# Patient Record
Sex: Male | Born: 1985 | Race: Black or African American | Hispanic: No | Marital: Married | Smoking: Never smoker
Health system: Southern US, Community
[De-identification: ages and names within clinical notes are randomized; demographics above are authoritative.]

## PROBLEM LIST (undated history)

## (undated) DIAGNOSIS — T7840XA Allergy, unspecified, initial encounter: Secondary | ICD-10-CM

## (undated) DIAGNOSIS — F419 Anxiety disorder, unspecified: Secondary | ICD-10-CM

## (undated) HISTORY — PX: VASECTOMY: SHX75

## (undated) HISTORY — DX: Allergy, unspecified, initial encounter: T78.40XA

## (undated) HISTORY — DX: Anxiety disorder, unspecified: F41.9

---

## 2005-03-08 ENCOUNTER — Emergency Department (HOSPITAL_COMMUNITY): Admission: EM | Admit: 2005-03-08 | Discharge: 2005-03-08 | Payer: Self-pay | Admitting: Family Medicine

## 2014-05-28 ENCOUNTER — Ambulatory Visit (INDEPENDENT_AMBULATORY_CARE_PROVIDER_SITE_OTHER): Admitting: Physician Assistant

## 2014-05-28 ENCOUNTER — Encounter: Payer: Self-pay | Admitting: Physician Assistant

## 2014-05-28 VITALS — BP 114/72 | HR 68 | Temp 98.0°F | Resp 18 | Ht 69.25 in | Wt 224.0 lb

## 2014-05-28 DIAGNOSIS — Z0000 Encounter for general adult medical examination without abnormal findings: Secondary | ICD-10-CM | POA: Diagnosis not present

## 2014-05-28 DIAGNOSIS — J309 Allergic rhinitis, unspecified: Secondary | ICD-10-CM | POA: Diagnosis not present

## 2014-05-28 MED ORDER — FEXOFENADINE-PSEUDOEPHED ER 60-120 MG PO TB12
1.0000 | ORAL_TABLET | Freq: Two times a day (BID) | ORAL | Status: DC | PRN
Start: 2014-05-28 — End: 2014-09-19

## 2014-05-28 NOTE — Progress Notes (Signed)
Patient ID: Richard Patrick MRN: [id number], DOB: 1985/05/18 29 y.o. Date of Encounter: 05/28/2014, 3:47 PM    Chief Complaint: Physical (CPE)  HPI: 29 y.o. y/o AA male here for CPE.   He is also being seen as a new patient to establish care.  He states that Allegra-D works well for his controlling his allergy symptoms. However he is wanting us to send in a prescription for this as it would be cheaper for him that way.  No other complaints or concerns today.  He is in the [employer]. He is married with 3 children ages 2920 months, 29 years old, and 29 years old. Says they just recently moved here from [place]. Prior to [place], [place]. Says he knows that they will be here for 3 years at least. 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  Says he is coming here this Wednesday morning to bring his daughter for an appointment. He can come fasting at that time and do lab work then.   Review of Systems: Consitutional: No fever, chills, fatigue, night sweats, lymphadenopathy, or weight changes. Eyes: No visual changes, eye redness, or discharge. ENT/Mouth: Ears: No otalgia, tinnitus, hearing loss, discharge. Nose:  sinus pain, or epistaxis. Throat: No sore throat, or teeth pain. Cardiovascular: No CP, palpitations, diaphoresis, DOE, edema, orthopnea, PND. Respiratory: No cough, hemoptysis, SOB, or wheezing. Gastrointestinal: No anorexia, dysphagia, reflux, pain, nausea, vomiting, hematemesis, diarrhea, constipation, BRBPR, or melena. Genitourinary: No dysuria, frequency, urgency, hematuria, incontinence, nocturia, decreased urinary stream, discharge, impotence, or testicular pain/masses. Musculoskeletal: No decreased ROM, myalgias, stiffness, joint swelling, or weakness. Skin: No rash, erythema, lesion changes, pain, warmth, jaundice, or pruritis. Neurological: No headache, dizziness, syncope,  seizures, tremors, memory loss, coordination problems, or paresthesias. Psychological: No anxiety, depression, hallucinations, SI/HI. Endocrine: No fatigue, polydipsia, polyphagia, polyuria, or known diabetes. All other systems were reviewed and are otherwise negative.  History reviewed. No pertinent past medical history.   History reviewed. No pertinent past surgical history.  Home Meds:  No outpatient prescriptions prior to visit.   No facility-administered medications prior to visit.    Allergies: No Known Allergies  History   Social History  . Marital Status: Married    Spouse Name: N/A  . Number of Children: N/A  . Years of Education: N/A   Occupational History  . Not on file.   Social History Main Topics  . Smoking status: Never Smoker   . Smokeless tobacco: Never Used  . Alcohol Use: 0.0 oz/week    0 Standard drinks or equivalent per week  . Drug Use: No  . Sexual Activity: Not on file   Other Topics Concern  . Not on file   Social History Narrative   Entered 05/2014:       Married.    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    Says was with Security Clearance. Just given this assignment 1 month ago.              History reviewed. No pertinent family history.  Physical Exam: Blood pressure 114/72, pulse 68, temperature 98 F (36.7 C), temperature source Oral, resp. rate 18, height 5' 9.25" (1.759 m), weight 224 lb (101.606 kg).  General: Well developed, well nourished,muscular AAM. Appears in no acute distress. HEENT: Normocephalic, atraumatic.  Conjunctiva pink, sclera non-icteric. Pupils 2 mm constricting to 1 mm, round, regular, and equally reactive to light and accomodation. EOMI. Internal auditory canal clear. TMs with good cone of light and without pathology. Nasal mucosa pink. Nares are without discharge. No sinus tenderness. Oral mucosa pink. Dentition good. Pharynx without exudate.      Neck: Supple. Trachea midline. No thyromegaly. Full ROM. No lymphadenopathy. Lungs: Clear to auscultation bilaterally without wheezes, rales, or rhonchi. Breathing is of normal effort and unlabored. Cardiovascular: RRR with S1 S2. No murmurs, rubs, or gallops. Distal pulses 2+ symmetrically. No carotid or abdominal bruits. Abdomen: Soft, non-tender, non-distended with normoactive bowel sounds. No hepatosplenomegaly or masses. No rebound/guarding. No CVA tenderness. No hernias. Musculoskeletal: Full range of motion and 5/5 strength throughout. Skin: Warm and moist without erythema, ecchymosis, wounds, or rash. Neuro: A+Ox3. CN II-XII grossly intact. Moves all extremities spontaneously. Full sensation throughout. Normal gait. DTR 2+ throughout upper and lower extremities.  Psych:  Responds to questions appropriately with a normal affect.   Assessment/Plan:  29 y.o. y/o  AA male here for CPE  -1. Visit for preventive health examination  A. Screening Labs: - CBC with Differential/Platelet; Future - COMPLETE METABOLIC PANEL WITH GFR; Future - Lipid panel; Future - TSH; Future - Vit D  25 hydroxy (rtn osteoporosis monitoring); Future   B. Screening For Prostate Cancer: Need to start this at age 82 given that he is African-American  C. Screening For Colorectal Cancer:  No indication to need this until age 27  D. Immunizations: Flu-------------------------N/A Tetanus------------------ Pt states that this is up to date and he has this record at home and will bring it and when he Returns here Wednesday Pneumococcal------------- no indication to need this until age 42 Zostavax----------------- not indicated until age 26   2. Allergic rhinitis, unspecified allergic rhinitis type - fexofenadine-pseudoephedrine (ALLEGRA-D) 60-120 MG per tablet; Take 1 tablet by mouth 2 (two) times daily as needed.  Dispense: 60 tablet; Refill: 6   Signed:   [address] [place], [place]  05/28/2014 3:47  PM

## 2014-05-29 ENCOUNTER — Encounter: Payer: Self-pay | Admitting: Family Medicine

## 2014-05-30 ENCOUNTER — Other Ambulatory Visit

## 2014-05-30 DIAGNOSIS — Z0000 Encounter for general adult medical examination without abnormal findings: Secondary | ICD-10-CM

## 2014-05-30 LAB — CBC WITH DIFFERENTIAL/PLATELET
BASOS ABS: 0.1 10*3/uL (ref 0.0–0.1)
Basophils Relative: 1 % (ref 0–1)
EOS ABS: 0.1 10*3/uL (ref 0.0–0.7)
Eosinophils Relative: 2 % (ref 0–5)
HCT: 46 % (ref 39.0–52.0)
Hemoglobin: 16.2 g/dL (ref 13.0–17.0)
Lymphocytes Relative: 31 % (ref 12–46)
Lymphs Abs: 2 10*3/uL (ref 0.7–4.0)
MCH: 31 pg (ref 26.0–34.0)
MCHC: 35.2 g/dL (ref 30.0–36.0)
MCV: 88 fL (ref 78.0–100.0)
MPV: 10.9 fL (ref 8.6–12.4)
Monocytes Absolute: 0.6 10*3/uL (ref 0.1–1.0)
Monocytes Relative: 9 % (ref 3–12)
NEUTROS ABS: 3.8 10*3/uL (ref 1.7–7.7)
Neutrophils Relative %: 57 % (ref 43–77)
PLATELETS: 191 10*3/uL (ref 150–400)
RBC: 5.23 MIL/uL (ref 4.22–5.81)
RDW: 13.4 % (ref 11.5–15.5)
WBC: 6.6 10*3/uL (ref 4.0–10.5)

## 2014-05-30 LAB — COMPLETE METABOLIC PANEL WITH GFR
ALT: 31 U/L (ref 0–53)
AST: 45 U/L — ABNORMAL HIGH (ref 0–37)
Albumin: 4.6 g/dL (ref 3.5–5.2)
Alkaline Phosphatase: 65 U/L (ref 39–117)
BUN: 17 mg/dL (ref 6–23)
CO2: 25 meq/L (ref 19–32)
CREATININE: 1.13 mg/dL (ref 0.50–1.35)
Calcium: 9.3 mg/dL (ref 8.4–10.5)
Chloride: 103 mEq/L (ref 96–112)
GFR, Est African American: >89 mL/min
GFR, Est Non African American: 87 mL/min
Glucose, Bld: 80 mg/dL (ref 70–99)
Potassium: 4.2 mEq/L (ref 3.5–5.3)
Sodium: 138 mEq/L (ref 135–145)
Total Bilirubin: 0.9 mg/dL (ref 0.2–1.2)
Total Protein: 7.5 g/dL (ref 6.0–8.3)

## 2014-05-30 LAB — TSH: TSH: 0.839 u[IU]/mL (ref 0.350–4.500)

## 2014-05-30 LAB — LIPID PANEL
CHOL/HDL RATIO: 2.9 ratio
CHOLESTEROL: 154 mg/dL (ref 0–200)
HDL: 53 mg/dL (ref >=40)
LDL Cholesterol: 88 mg/dL (ref 0–99)
Triglycerides: 64 mg/dL (ref <150)
VLDL: 13 mg/dL (ref 0–40)

## 2014-05-31 LAB — VITAMIN D 25 HYDROXY (VIT D DEFICIENCY, FRACTURES): VIT D 25 HYDROXY: 18 ng/mL — AB (ref 30–100)

## 2014-06-05 ENCOUNTER — Telehealth: Payer: Self-pay | Admitting: Family Medicine

## 2014-06-05 DIAGNOSIS — E559 Vitamin D deficiency, unspecified: Secondary | ICD-10-CM | POA: Insufficient documentation

## 2014-06-05 NOTE — Telephone Encounter (Signed)
-----   Message from Dorena BodoMary B Dixon, PA-C sent at 05/31/2014  7:47 AM EDT ----- Vitamin D level is low. Start over-the-counter vitamin D 1,000 IU QD.. Add vitamin D deficiency to problem list and add the vitamin D 1000 units to the medicine list. All other labs are normal. Cholesterol levels are excellent.

## 2014-06-05 NOTE — Telephone Encounter (Signed)
Pt aware of lab results and provider recommendations 

## 2014-09-19 ENCOUNTER — Encounter: Payer: Self-pay | Admitting: Physician Assistant

## 2014-09-19 ENCOUNTER — Ambulatory Visit (INDEPENDENT_AMBULATORY_CARE_PROVIDER_SITE_OTHER): Admitting: Physician Assistant

## 2014-09-19 VITALS — BP 98/64 | HR 72 | Temp 98.5°F | Resp 16 | Ht 69.0 in | Wt 218.0 lb

## 2014-09-19 DIAGNOSIS — L700 Acne vulgaris: Secondary | ICD-10-CM | POA: Diagnosis not present

## 2014-09-19 MED ORDER — CLINDAMYCIN PHOS-BENZOYL PEROX 1-5 % EX GEL: Freq: Two times a day (BID) | CUTANEOUS | Status: DC

## 2014-09-19 MED ORDER — MINOCYCLINE HCL 50 MG PO TABS: 50.0000 mg | ORAL_TABLET | Freq: Two times a day (BID) | ORAL | Status: DC

## 2014-09-19 NOTE — Progress Notes (Signed)
    Patient ID: Richard Patrick MRN: [id number], DOB: [birth year], 29 y.o. Date of Encounter: 09/19/2014, 2:27 PM    Chief Complaint:  Chief Complaint  Patient presents with  . Acne     HPI: 29 y.o. year old AA male here to get treatment for acne.   Points to some acne that is on right side of lower chin and neck. Says this is main area where he has been getting significant acne recently. Says sometimes gets some bumps scattered around other areas of the face. Currently is using no medication/treatment for this.  States that he does not get acne on his chest or his back/shoulders.     Home Meds:   Outpatient Prescriptions Prior to Visit  Medication Sig Dispense Refill  . cholecalciferol (VITAMIN D) 1000 UNITS tablet Take 1,000 Units by mouth daily.    . fexofenadine-pseudoephedrine (ALLEGRA-D) 60-120 MG per tablet Take 1 tablet by mouth 2 (two) times daily as needed. 60 tablet 6   No facility-administered medications prior to visit.    Allergies: No Known Allergies    Review of Systems: See HPI for pertinent ROS. All other ROS negative.    Physical Exam: Blood pressure 98/64, pulse 72, temperature 98.5 F (36.9 C), temperature source Oral, resp. rate 16, height  (1.753 m), weight 218 lb (98.884 kg)., Body mass index is 32.18 kg/(m^2). General: WNWD AAM.  Appears in no acute distress. Neck: Supple. No thyromegaly. No lymphadenopathy. Lungs: Clear bilaterally to auscultation without wheezes, rales, or rhonchi. Breathing is unlabored. Heart: Regular rhythm. No murmurs, rubs, or gallops. Msk:  Strength and tone normal for age. Skin: Area between chin/neck: There are a couple of cystic acne lesions present in this area.  There are scattered acne papules on cheeks and forehead.  Neuro: Alert and oriented X 3. Moves all extremities spontaneously. Gait is normal. CNII-XII grossly in tact. Psych:  Responds to questions appropriately with a normal affect.     ASSESSMENT AND  PLAN:  29 y.o. year old male with  1. Acne vulgaris - minocycline (DYNACIN) 50 MG tablet; Take 1 tablet (50 mg total) by mouth 2 (two) times daily.  Dispense: 60 tablet; Refill: 5 - clindamycin-benzoyl peroxide (BENZACLIN) gel; Apply topically 2 (two) times daily.  Dispense: 25 g; Refill: 2  Told him he can take the minocycline for a couple of months to get the acne controlled and then can try coming off this medication. Told him that when applying the gel, to apply very small amount just right on the acne bump. Explained that if he applies a larger amount or to a larger area of skin that is going to cause irritation and dryness.  Follow-up if this does not keep his acne controlled.  Murray Hodgkins [place], [place], [facility] 09/19/2014 2:27 PM

## 2014-11-29 ENCOUNTER — Emergency Department (HOSPITAL_COMMUNITY)
Admission: EM | Admit: 2014-11-29 | Discharge: 2014-11-29 | Disposition: A | Discharge status: Home / Self Care | Attending: Emergency Medicine | Admitting: Emergency Medicine

## 2014-11-29 ENCOUNTER — Encounter (HOSPITAL_COMMUNITY): Payer: Self-pay | Admitting: Emergency Medicine

## 2014-11-29 ENCOUNTER — Emergency Department (HOSPITAL_COMMUNITY)

## 2014-11-29 DIAGNOSIS — Z792 Long term (current) use of antibiotics: Secondary | ICD-10-CM | POA: Diagnosis not present

## 2014-11-29 DIAGNOSIS — R0789 Other chest pain: Secondary | ICD-10-CM | POA: Diagnosis present

## 2014-11-29 LAB — BASIC METABOLIC PANEL
Anion gap: 7 (ref 5–15)
BUN: 19 mg/dL (ref 6–20)
CO2: 26 mmol/L (ref 22–32)
CREATININE: 1.07 mg/dL (ref 0.61–1.24)
Calcium: 9.6 mg/dL (ref 8.9–10.3)
Chloride: 106 mmol/L (ref 101–111)
GFR calc Af Amer: >60 mL/min (ref >60)
GFR calc non Af Amer: >60 mL/min (ref >60)
Glucose, Bld: 92 mg/dL (ref 65–99)
Potassium: 4.1 mmol/L (ref 3.5–5.1)
Sodium: 139 mmol/L (ref 135–145)

## 2014-11-29 LAB — CBC WITH DIFFERENTIAL/PLATELET
Basophils Absolute: 0 10*3/uL (ref 0.0–0.1)
Basophils Relative: 0 %
EOS ABS: 0.1 10*3/uL (ref 0.0–0.7)
Eosinophils Relative: 1 %
HCT: 43.7 % (ref 39.0–52.0)
HEMOGLOBIN: 15.9 g/dL (ref 13.0–17.0)
LYMPHS ABS: 1.9 10*3/uL (ref 0.7–4.0)
LYMPHS PCT: 34 %
MCH: 31.4 pg (ref 26.0–34.0)
MCHC: 36.4 g/dL — ABNORMAL HIGH (ref 30.0–36.0)
MCV: 86.2 fL (ref 78.0–100.0)
Monocytes Absolute: 0.4 10*3/uL (ref 0.1–1.0)
Monocytes Relative: 8 %
NEUTROS PCT: 57 %
Neutro Abs: 3.1 10*3/uL (ref 1.7–7.7)
Platelets: 136 10*3/uL — ABNORMAL LOW (ref 150–400)
RBC: 5.07 MIL/uL (ref 4.22–5.81)
RDW: 11.9 % (ref 11.5–15.5)
WBC: 5.5 10*3/uL (ref 4.0–10.5)

## 2014-11-29 LAB — I-STAT TROPONIN, ED: TROPONIN I, POC: 0 ng/mL (ref 0.00–0.08)

## 2014-11-29 NOTE — ED Provider Notes (Signed)
CSN: [id number]     Arrival date & time 11/29/14  1242 History   First MD Initiated Contact with Patient 11/29/14 1312     Chief Complaint  Patient presents with  . Rib Injury     (Consider location/radiation/quality/duration/timing/severity/associated sxs/prior Treatment) HPI Comments: Pt comes in with left sided chest pain that has been going on for several weeks. The pain is constant in nature. Nothing really makes it better or worse. No recent injury. He has not had cough or fever. Denies sob. He tried ibuprofen without relief.  No n/v. He states that he has been very tired. He states that he had similar symptoms about 6 weeks ago that lasted for about a week and it resolved on its own  The history is provided by the patient. No language interpreter was used.    Past Medical History  Diagnosis Date  . Allergy     seasonal   History reviewed. No pertinent past surgical history. No family history on file. Social History  Substance Use Topics  . Smoking status: Never Smoker   . Smokeless tobacco: Never Used  . Alcohol Use: 0.0 oz/week    0 Standard drinks or equivalent per week     Comment: occasionally    Review of Systems  All other systems reviewed and are negative.     Allergies  Review of patient's allergies indicates no known allergies.  Home Medications   Prior to Admission medications   Medication Sig Start Date End Date Taking? Authorizing Provider  clindamycin-benzoyl peroxide (BENZACLIN) gel Apply topically 2 (two) times daily. 09/19/14   Patriciaann Clan Dixon, PA-C  minocycline (DYNACIN) 50 MG tablet Take 1 tablet (50 mg total) by mouth 2 (two) times daily. 09/19/14   Patriciaann Clan Dixon, PA-C   BP 134/84 mmHg  Pulse 66  Temp(Src) 98.4 F (36.9 C) (Oral)  Resp 18  Ht  (1.727 m)  Wt 208 lb (94.348 kg)  BMI 31.63 kg/m2  SpO2 97% Physical Exam  Constitutional: He is oriented to person, place, and time. He appears well-developed and well-nourished.  HENT:  Head:  Normocephalic and atraumatic.  Cardiovascular: Normal rate and regular rhythm.   Pulmonary/Chest: Effort normal and breath sounds normal.  Lateral left mid chest tender to palpation  Abdominal: Soft. Bowel sounds are normal. There is no tenderness.  Musculoskeletal: Normal range of motion.  Neurological: He is alert and oriented to person, place, and time.  Skin: Skin is warm and dry.  Nursing note and vitals reviewed.   ED Course  Procedures (including critical care time) Labs Review Labs Reviewed  CBC WITH DIFFERENTIAL/PLATELET - Abnormal; Notable for the following:    MCHC 36.4 (*)    Platelets 136 (*)    All other components within normal limits  BASIC METABOLIC PANEL  I-STAT TROPOININ, ED    Imaging Review Dg Chest 2 View  11/29/2014  CLINICAL DATA:  Left chest pain for 2 weeks EXAM: CHEST  2 VIEW COMPARISON:  None. FINDINGS: The heart size and mediastinal contours are within normal limits. Both lungs are clear. The visualized skeletal structures are unremarkable. IMPRESSION: No active cardiopulmonary disease. Electronically Signed   By: Sherian Rein M.D.   On: 11/29/2014 14:03   I have personally reviewed and evaluated these images and lab results as part of my medical decision-making.   EKG Interpretation   Date/Time:  Thursday November 29 2014 14:04:36 EDT Ventricular Rate:  61 PR Interval:  159 QRS Duration: 83 QT Interval:  398 QTC Calculation: 401 R Axis:   46 Text Interpretation:  Sinus arrhythmia RSR' in V1 or V2, probably normal  variant No prior for comparison Confirmed by [facility]  MD, BLAIR ([id number]) on  11/29/2014 2:10:34 PM      MDM   Final diagnoses:  Chest wall pain    Not acute findings to suggest reason for symptoms. Discussed use of continued anti inflammatories with pt. Discussed return precautions.    Teressa LowerVrinda Marriana Hibberd, NP 11/29/14 1510  Elwin MochaBlair Walden, MD 11/29/14 [phone]

## 2014-11-29 NOTE — ED Notes (Signed)
PA at bedside.

## 2014-11-29 NOTE — Discharge Instructions (Signed)

## 2014-11-29 NOTE — ED Notes (Signed)
Patient reports progressive left sided rib pain x several weeks, constant in nature now. Patient denies any injury. Denies sob or cp. Patient also reports generalized fatigue with no specific symptoms.

## 2014-11-29 NOTE — ED Notes (Signed)
Onset 2.5 weeks ago left rib pain denies trauma pain currently 4/10 sharp pain.

## 2014-11-30 ENCOUNTER — Ambulatory Visit: Admitting: Family Medicine

## 2015-02-14 ENCOUNTER — Other Ambulatory Visit: Payer: Self-pay | Admitting: *Deleted

## 2015-02-14 DIAGNOSIS — L700 Acne vulgaris: Secondary | ICD-10-CM

## 2015-02-14 MED ORDER — CLINDAMYCIN PHOS-BENZOYL PEROX 1-5 % EX GEL: Freq: Two times a day (BID) | CUTANEOUS | Status: DC

## 2015-02-14 MED ORDER — MINOCYCLINE HCL 50 MG PO TABS: 50.0000 mg | ORAL_TABLET | Freq: Two times a day (BID) | ORAL | Status: DC

## 2015-02-14 NOTE — Telephone Encounter (Signed)
Patient in office with daughter.   Requested refill on meds.   Prescription sent to pharmacy.

## 2015-03-13 ENCOUNTER — Telehealth: Payer: Self-pay | Admitting: Physician Assistant

## 2015-03-13 DIAGNOSIS — L700 Acne vulgaris: Secondary | ICD-10-CM

## 2015-03-13 MED ORDER — MINOCYCLINE HCL 50 MG PO TABS: 50.0000 mg | ORAL_TABLET | Freq: Two times a day (BID) | ORAL | Status: DC

## 2015-03-13 NOTE — Telephone Encounter (Signed)
Prescription sent to pharmacy.

## 2015-03-13 NOTE — Telephone Encounter (Signed)
Patient is switching pharmacies and would like his minocycline sent to [facility]

## 2015-03-19 ENCOUNTER — Ambulatory Visit (INDEPENDENT_AMBULATORY_CARE_PROVIDER_SITE_OTHER): Admitting: Family Medicine

## 2015-03-19 ENCOUNTER — Encounter: Payer: Self-pay | Admitting: Family Medicine

## 2015-03-19 VITALS — BP 128/70 | HR 72 | Temp 98.5°F | Resp 16 | Ht 68.5 in | Wt 222.0 lb

## 2015-03-19 DIAGNOSIS — Z309 Encounter for contraceptive management, unspecified: Secondary | ICD-10-CM

## 2015-03-19 DIAGNOSIS — L700 Acne vulgaris: Secondary | ICD-10-CM

## 2015-03-19 DIAGNOSIS — Z3009 Encounter for other general counseling and advice on contraception: Secondary | ICD-10-CM

## 2015-03-19 MED ORDER — CLINDAMYCIN PHOS-BENZOYL PEROX 1-5 % EX GEL: Freq: Two times a day (BID) | CUTANEOUS | Status: DC

## 2015-03-19 MED ORDER — MINOCYCLINE HCL 50 MG PO CAPS: 50.0000 mg | ORAL_CAPSULE | Freq: Two times a day (BID) | ORAL | Status: DC

## 2015-03-19 NOTE — Patient Instructions (Addendum)
[facility] - Dr Vernie Ammons  F/U as needed

## 2015-03-19 NOTE — Progress Notes (Signed)
Patient ID: Richard Patrick, male   DOB: [birth year], 30 y.o.   MRN: [id number]   Subjective:    Patient ID: Richard Patrick, male    DOB: 28-Sep-1985, 30 y.o.   MRN: [id number]  Patient presents for Referral issue here for referral for vasectomy. He has 3 children and he has wife desire contraception. He does not have any cardiac disease, history of any blood clots or difficulty with bleeding. He is only on minocycline and topical BenzaClin gel for his acne vulgaris. 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 He exercises daily. He has no problems with erectile dysfunction or any urinary problems.    Review Of Systems:  GEN- denies fatigue, fever, weight loss,weakness, recent illness ABD- denies N/V, change in stools, abd pain GU- denies dysuria, hematuria, dribbling, incontinence        Objective:    BP 128/70 mmHg  Pulse 72  Temp(Src) 98.5 F (36.9 C) (Oral)  Resp 16  Ht 5' 8.5" (1.74 m)  Wt 222 lb (100.699 kg)  BMI 33.26 kg/m2 GEN- NAD, alert and oriented x3 CVS- RRR, no murmur RESP-CTAB GU- deferred          Assessment & Plan:      Problem List Items Addressed This Visit    Acne vulgaris - Primary   Relevant Medications   clindamycin-benzoyl peroxide (BENZACLIN) gel   minocycline (MINOCIN,DYNACIN) 50 MG capsule    Other Visit Diagnoses    Vasectomy evaluation        Referral to urology     Relevant Orders    Ambulatory referral to Urology       Note: This dictation was prepared with Dragon dictation along with smaller phrase technology. Any transcriptional errors that result from this process are unintentional.

## 2015-03-29 ENCOUNTER — Ambulatory Visit: Admitting: Family Medicine

## 2015-05-30 ENCOUNTER — Encounter: Payer: Self-pay | Admitting: Family Medicine

## 2015-06-05 ENCOUNTER — Emergency Department (HOSPITAL_COMMUNITY)

## 2015-06-05 ENCOUNTER — Encounter (HOSPITAL_COMMUNITY): Payer: Self-pay | Admitting: Emergency Medicine

## 2015-06-05 DIAGNOSIS — R011 Cardiac murmur, unspecified: Secondary | ICD-10-CM | POA: Diagnosis not present

## 2015-06-05 DIAGNOSIS — Z792 Long term (current) use of antibiotics: Secondary | ICD-10-CM | POA: Diagnosis not present

## 2015-06-05 DIAGNOSIS — R0789 Other chest pain: Secondary | ICD-10-CM | POA: Insufficient documentation

## 2015-06-05 DIAGNOSIS — R079 Chest pain, unspecified: Secondary | ICD-10-CM | POA: Diagnosis present

## 2015-06-05 LAB — CBC
HCT: 44.4 % (ref 39.0–52.0)
Hemoglobin: 16.2 g/dL (ref 13.0–17.0)
MCH: 31.3 pg (ref 26.0–34.0)
MCHC: 36.5 g/dL — AB (ref 30.0–36.0)
MCV: 85.9 fL (ref 78.0–100.0)
Platelets: 183 10*3/uL (ref 150–400)
RBC: 5.17 MIL/uL (ref 4.22–5.81)
RDW: 12 % (ref 11.5–15.5)
WBC: 10.3 10*3/uL (ref 4.0–10.5)

## 2015-06-05 LAB — BASIC METABOLIC PANEL
Anion gap: 11 (ref 5–15)
BUN: 18 mg/dL (ref 6–20)
CALCIUM: 9.9 mg/dL (ref 8.9–10.3)
CO2: 24 mmol/L (ref 22–32)
CREATININE: 1.25 mg/dL — AB (ref 0.61–1.24)
Chloride: 104 mmol/L (ref 101–111)
GFR calc Af Amer: >60 mL/min (ref >60)
GLUCOSE: 116 mg/dL — AB (ref 65–99)
Potassium: 4.3 mmol/L (ref 3.5–5.1)
Sodium: 139 mmol/L (ref 135–145)

## 2015-06-05 LAB — I-STAT TROPONIN, ED: TROPONIN I, POC: 0.03 ng/mL (ref 0.00–0.08)

## 2015-06-05 NOTE — ED Notes (Signed)
Pt. reports right chest pain onset last night " sharp" worse when raising his right arm , denies SOB , no nausea or diaphoresis .

## 2015-06-06 ENCOUNTER — Emergency Department (HOSPITAL_COMMUNITY)
Admission: EM | Admit: 2015-06-06 | Discharge: 2015-06-06 | Disposition: A | Discharge status: Home / Self Care | Attending: Emergency Medicine | Admitting: Emergency Medicine

## 2015-06-06 DIAGNOSIS — R0789 Other chest pain: Secondary | ICD-10-CM

## 2015-06-06 MED ORDER — IBUPROFEN 800 MG PO TABS
800.0000 mg | ORAL_TABLET | Freq: Once | ORAL | Status: DC
  Filled 2015-06-06: qty 1

## 2015-06-06 MED ORDER — NAPROXEN 500 MG PO TABS: 500.0000 mg | ORAL_TABLET | Freq: Two times a day (BID) | ORAL | Status: DC

## 2015-06-06 NOTE — Discharge Instructions (Signed)
Talk with your doctor about whether ultrasound would be indicated. Apply ice or heat as needed.    Chest Wall Pain Chest wall pain is pain in or around the bones and muscles of your chest. Sometimes, an injury causes this pain. Sometimes, the cause may not be known. This pain may take several weeks or longer to get better. HOME CARE INSTRUCTIONS  Pay attention to any changes in your symptoms. Take these actions to help with your pain:   Rest as told by your health care provider.   Avoid activities that cause pain. These include any activities that use your chest muscles or your abdominal and side muscles to lift heavy items.   If directed, apply ice to the painful area:  Put ice in a plastic bag.  Place a towel between your skin and the bag.  Leave the ice on for 20 minutes, 2-3 times per day.  Take over-the-counter and prescription medicines only as told by your health care provider.  Do not use tobacco products, including cigarettes, chewing tobacco, and e-cigarettes. If you need help quitting, ask your health care provider.  Keep all follow-up visits as told by your health care provider. This is important. SEEK MEDICAL CARE IF:  You have a fever.  Your chest pain becomes worse.  You have new symptoms. SEEK IMMEDIATE MEDICAL CARE IF:  You have nausea or vomiting.  You feel sweaty or light-headed.  You have a cough with phlegm (sputum) or you cough up blood.  You develop shortness of breath.   This information is not intended to replace advice given to you by your health care provider. Make sure you discuss any questions you have with your health care provider.   Document Released: 02/02/2005 Document Revised: 10/24/2014 Document Reviewed: 04/30/2014 Elsevier Interactive Patient Education 2016 Elsevier Inc.  Naproxen and naproxen sodium oral immediate-release tablets What is this medicine? NAPROXEN (na PROX en) is a non-steroidal anti-inflammatory drug (NSAID).  It is used to reduce swelling and to treat pain. This medicine may be used for dental pain, headache, or painful monthly periods. It is also used for painful joint and muscular problems such as arthritis, tendinitis, bursitis, and gout. This medicine may be used for other purposes; ask your health care provider or pharmacist if you have questions. What should I tell my health care provider before I take this medicine? They need to know if you have any of these conditions: -asthma -cigarette smoker -drink more than 3 alcohol containing drinks a day -heart disease or circulation problems such as heart failure or leg edema (fluid retention) -high blood pressure -kidney disease -liver disease -stomach bleeding or ulcers -an unusual or allergic reaction to naproxen, aspirin, other NSAIDs, other medicines, foods, dyes, or preservatives -pregnant or trying to get pregnant -breast-feeding How should I use this medicine? Take this medicine by mouth with a glass of water. Follow the directions on the prescription label. Take it with food if your stomach gets upset. Try to not lie down for at least 10 minutes after you take it. Take your medicine at regular intervals. Do not take your medicine more often than directed. Long-term, continuous use may increase the risk of heart attack or stroke. A special MedGuide will be given to you by the pharmacist with each prescription and refill. Be sure to read this information carefully each time. Talk to your pediatrician regarding the use of this medicine in children. Special care may be needed. Overdosage: If you think you have taken  too much of this medicine contact a poison control center or emergency room at once. NOTE: This medicine is only for you. Do not share this medicine with others. What if I miss a dose? If you miss a dose, take it as soon as you can. If it is almost time for your next dose, take only that dose. Do not take double or extra doses. What  may interact with this medicine? -alcohol -aspirin -cidofovir -diuretics -lithium -methotrexate -other drugs for inflammation like ketorolac or prednisone -pemetrexed -probenecid -warfarin This list may not describe all possible interactions. Give your health care provider a list of all the medicines, herbs, non-prescription drugs, or dietary supplements you use. Also tell them if you smoke, drink alcohol, or use illegal drugs. Some items may interact with your medicine. What should I watch for while using this medicine? Tell your doctor or health care professional if your pain does not get better. Talk to your doctor before taking another medicine for pain. Do not treat yourself. This medicine does not prevent heart attack or stroke. In fact, this medicine may increase the chance of a heart attack or stroke. The chance may increase with longer use of this medicine and in people who have heart disease. If you take aspirin to prevent heart attack or stroke, talk with your doctor or health care professional. Do not take other medicines that contain aspirin, ibuprofen, or naproxen with this medicine. Side effects such as stomach upset, nausea, or ulcers may be more likely to occur. Many medicines available without a prescription should not be taken with this medicine. This medicine can cause ulcers and bleeding in the stomach and intestines at any time during treatment. Do not smoke cigarettes or drink alcohol. These increase irritation to your stomach and can make it more susceptible to damage from this medicine. Ulcers and bleeding can happen without warning symptoms and can cause death. You may get drowsy or dizzy. Do not drive, use machinery, or do anything that needs mental alertness until you know how this medicine affects you. Do not stand or sit up quickly, especially if you are an older patient. This reduces the risk of dizzy or fainting spells. This medicine can cause you to bleed more  easily. Try to avoid damage to your teeth and gums when you brush or floss your teeth. What side effects may I notice from receiving this medicine? Side effects that you should report to your doctor or health care professional as soon as possible: -black or bloody stools, blood in the urine or vomit -blurred vision -chest pain -difficulty breathing or wheezing -nausea or vomiting -severe stomach pain -skin rash, skin redness, blistering or peeling skin, hives, or itching -slurred speech or weakness on one side of the body -swelling of eyelids, throat, lips -unexplained weight gain or swelling -unusually weak or tired -yellowing of eyes or skin Side effects that usually do not require medical attention (report to your doctor or health care professional if they continue or are bothersome): -constipation -headache -heartburn This list may not describe all possible side effects. Call your doctor for medical advice about side effects. You may report side effects to FDA at 1-800-FDA-1088. Where should I keep my medicine? Keep out of the reach of children. Store at room temperature between 15 and 30 degrees C (59 and 86 degrees F). Keep container tightly closed. Throw away any unused medicine after the expiration date. NOTE: This sheet is a summary. It may not cover all possible information.  If you have questions about this medicine, talk to your doctor, pharmacist, or health care provider.    2016, Elsevier/Gold Standard. (2009-02-04 20:10:16)

## 2015-06-06 NOTE — ED Provider Notes (Signed)
CSN: [id number]     Arrival date & time 06/05/15  2119 History  By signing my name below, I, Richard Patrick, attest that this documentation has been prepared under the direction and in the presence of Dione Boozeavid Indiah Heyden, MD. Electronically Signed: Bethel BornBritney Patrick, ED Scribe. 06/06/2015. 1:00 AM   Chief Complaint  Patient presents with  . Chest Pain   The history is provided by the patient. No language interpreter was used.   Richard Patrick is a 30 y.o. male who presents to the Emergency Department complaining of constant, sharp, 6/10 in severity,  right sided chest pain with onset yesterday. The pain is exacerbated by raising the right arm and bending forward. 5 years ago he had a similar pain on the left side of his chest with a thrombosed vein. He denies any new lifting, twisting, or bending. He took nothing for pain PTA. Pt denies SOB, cough, nausea, and sweating. His PCP is Dr. Jeanice Limurham.   Past Medical History  Diagnosis Date  . Allergy     seasonal   History reviewed. No pertinent past surgical history. No family history on file. Social History  Substance Use Topics  . Smoking status: Never Smoker   . Smokeless tobacco: Never Used  . Alcohol Use: 0.0 oz/week    0 Standard drinks or equivalent per week     Comment: occasionally    Review of Systems  Constitutional: Negative for diaphoresis.  Respiratory: Negative for cough and shortness of breath.   Cardiovascular: Positive for chest pain.  Gastrointestinal: Negative for nausea and vomiting.  All other systems reviewed and are negative.   Allergies  Review of patient's allergies indicates no known allergies.  Home Medications   Prior to Admission medications   Medication Sig Start Date End Date Taking? Authorizing Provider  clindamycin-benzoyl peroxide (BENZACLIN) gel Apply topically 2 (two) times daily. 03/19/15   Salley ScarletKawanta F Samak, MD  minocycline (MINOCIN,DYNACIN) 50 MG capsule Take 1 capsule (50 mg total) by mouth 2 (two)  times daily. 03/19/15   Salley ScarletKawanta F Hackett, MD  naproxen (NAPROSYN) 500 MG tablet Take 1 tablet (500 mg total) by mouth 2 (two) times daily. 06/06/15   Dione Boozeavid Beckett Hickmon, MD   BP 118/70 mmHg  Pulse 76  Temp(Src) 98.2 F (36.8 C) (Oral)  Resp 18  Ht 5\' 8"  (1.727 m)  Wt 222 lb 5 oz (100.84 kg)  BMI 33.81 kg/m2  SpO2 99% Physical Exam  Constitutional: He is oriented to person, place, and time. He appears well-developed and well-nourished.  HENT:  Head: Normocephalic and atraumatic.  Eyes: EOM are normal. Pupils are equal, round, and reactive to light.  Neck: Normal range of motion. Neck supple. No JVD present.  Cardiovascular: Normal rate, regular rhythm and intact distal pulses.   Murmur heard. Pulmonary/Chest: Effort normal and breath sounds normal. He has no wheezes. He has no rales. He exhibits tenderness.  Tenderness at right anterior chest wall at the lateral margin of the pectoralis muscle   Abdominal: Soft. He exhibits no distension and no mass. There is no tenderness.  Musculoskeletal: Normal range of motion. He exhibits no edema.  Lymphadenopathy:    He has no cervical adenopathy.  Neurological: He is alert and oriented to person, place, and time. No cranial nerve deficit. He exhibits normal muscle tone. Coordination normal.  Skin: Skin is warm and dry. No rash noted.  Psychiatric: He has a normal mood and affect. His behavior is normal. Judgment and thought content normal.  Nursing note and  vitals reviewed.   ED Course  Procedures (including critical care time) DIAGNOSTIC STUDIES: Oxygen Saturation is 99% on RA,  normal by my interpretation.    COORDINATION OF CARE: 12:46 AM Discussed treatment plan which includes lab work, CXR, EKG, and Ibuprofen with pt at bedside and pt agreed to plan.  Labs Review Labs Reviewed  BASIC METABOLIC PANEL - Abnormal; Notable for the following:    Glucose, Bld 116 (*)    Creatinine, Ser 1.25 (*)    All other components within normal limits   CBC - Abnormal; Notable for the following:    MCHC 36.5 (*)    All other components within normal limits  I-STAT TROPOININ, ED    Imaging Review Dg Chest 2 View  06/05/2015  CLINICAL DATA:  Right lower chest pain for 2 days. EXAM: CHEST  2 VIEW COMPARISON:  11/29/2014 FINDINGS: The cardiomediastinal contours are normal. The lungs are clear. Pulmonary vasculature is normal. No consolidation, pleural effusion, or pneumothorax. No acute osseous abnormalities are seen. IMPRESSION: No active cardiopulmonary disease. Electronically Signed   By: Rubye Oaks M.D.   On: 06/05/2015 21:50   I have personally reviewed and evaluated these images and lab results as part of my medical decision-making.   EKG Interpretation   Date/Time:  Wednesday June 05 2015 21:22:26 EDT Ventricular Rate:  88 PR Interval:  152 QRS Duration: 74 QT Interval:  332 QTC Calculation: 401 R Axis:   35 Text Interpretation:  Normal sinus rhythm with sinus arrhythmia Normal ECG  When compared with ECG of 11/29/2014, No significant change was found  Confirmed by [facility]  MD, Lamanda Rudder ([id number]) on 06/06/2015 12:30:48 AM      MDM   Final diagnoses:  Chest wall pain    Chest wall pain which is clearly musculoskeletal. Patient is concerned because in the past, he was diagnosed with superficial blood clot in his chest. I reviewed his old records he has a prior ED visit for chest pain. He also brought his medical records with him and in 2012, he had chest pain but he had a linear mass that was felt and ultrasound was felt to be consistent with superficial phlebitis but not diagnostic. He has no similar physical findings today and was reassured about that. He still concerned about superficial blood clots, so I have advised him to follow-up with his PCP to discuss possible elective outpatient ultrasound. In the meantime, he is given a prescription for naproxen and told to use ice and/or heat and also to use over-the-counter  acetaminophen as needed.  I personally performed the services described in this documentation, which was scribed in my presence. The recorded information has been reviewed and is accurate.      Dione Booze, MD 06/06/15 [phone]

## 2015-06-06 NOTE — ED Notes (Signed)
Pt c/o R lateral chest pain; hx of "thrombosis" to left side of chest in 2012. Pain worsens when lifting arm.

## 2015-06-17 ENCOUNTER — Encounter: Payer: Self-pay | Admitting: Family Medicine

## 2015-06-17 ENCOUNTER — Telehealth: Payer: Self-pay | Admitting: Family Medicine

## 2015-06-17 MED ORDER — CIPROFLOXACIN HCL 0.3 % OP SOLN: OPHTHALMIC | Status: DC

## 2015-06-17 NOTE — Telephone Encounter (Signed)
Pt's wife called to let Dr. Jeanice Limurham know that he has pink eye. She is hoping that we will be able to call in a prescription for him. [facility] [phone]- [phone]

## 2015-06-17 NOTE — Telephone Encounter (Signed)
This encounter was created in error - please disregard.

## 2015-06-17 NOTE — Telephone Encounter (Signed)
To MD

## 2015-06-17 NOTE — Telephone Encounter (Signed)
Ciprofloxacin drops called in. He needs to discard his contacts and wear glasses until the infection has cleared

## 2015-06-17 NOTE — Telephone Encounter (Signed)
Pt's wife advised.

## 2015-06-17 NOTE — Telephone Encounter (Signed)
Patient wife was advised of MD recommendations per front office staff.

## 2015-06-25 ENCOUNTER — Other Ambulatory Visit: Payer: Self-pay | Admitting: *Deleted

## 2015-06-25 MED ORDER — CIPROFLOXACIN HCL 0.3 % OP SOLN: OPHTHALMIC | Status: DC

## 2015-06-25 NOTE — Telephone Encounter (Signed)
Received call from patient wife.   Reports that patient has not picked up prescription drops for conjunctivitis.   Requested to have them re-sent to pharmacy.  Prescription sent to pharmacy.

## 2015-07-02 ENCOUNTER — Ambulatory Visit (INDEPENDENT_AMBULATORY_CARE_PROVIDER_SITE_OTHER): Admitting: Family Medicine

## 2015-07-02 ENCOUNTER — Encounter: Payer: Self-pay | Admitting: Family Medicine

## 2015-07-02 VITALS — BP 112/78 | HR 82 | Temp 98.6°F | Resp 12 | Ht 68.0 in | Wt 221.0 lb

## 2015-07-02 DIAGNOSIS — I809 Phlebitis and thrombophlebitis of unspecified site: Secondary | ICD-10-CM | POA: Diagnosis not present

## 2015-07-02 DIAGNOSIS — R768 Other specified abnormal immunological findings in serum: Secondary | ICD-10-CM

## 2015-07-02 DIAGNOSIS — B360 Pityriasis versicolor: Secondary | ICD-10-CM

## 2015-07-02 DIAGNOSIS — G44219 Episodic tension-type headache, not intractable: Secondary | ICD-10-CM

## 2015-07-02 LAB — C-REACTIVE PROTEIN

## 2015-07-02 LAB — BASIC METABOLIC PANEL
BUN: 19 mg/dL (ref 7–25)
CALCIUM: 9.8 mg/dL (ref 8.6–10.3)
CO2: 24 mmol/L (ref 20–31)
Chloride: 105 mmol/L (ref 98–110)
Creat: 1.08 mg/dL (ref 0.60–1.35)
GLUCOSE: 85 mg/dL (ref 70–99)
Potassium: 4.5 mmol/L (ref 3.5–5.3)
Sodium: 140 mmol/L (ref 135–146)

## 2015-07-02 LAB — SEDIMENTATION RATE: Sed Rate: 4 mm/hr (ref 0–15)

## 2015-07-02 MED ORDER — SUMATRIPTAN SUCCINATE 100 MG PO TABS: 100.0000 mg | ORAL_TABLET | ORAL | Status: DC | PRN

## 2015-07-02 MED ORDER — KETOCONAZOLE 2 % EX CREA: 1.0000 "application " | TOPICAL_CREAM | Freq: Two times a day (BID) | CUTANEOUS | Status: DC

## 2015-07-02 NOTE — Progress Notes (Signed)
Patient ID: Richard Patrick, male   DOB: [birth year], 30 y.o.   MRN: [id number]    Subjective:    Patient ID: Richard Patrick, male    DOB: [birth year], 30 y.o.   MRN: [id number]  Patient presents for Frequent HA and Chest Pain Patient here with headaches over the past couple months. He will get them typically once or twice a week typically on the right side but he also gets an occipital region down his neck. Daily thing that helps resolve his headache is drinking Coca-Cola. He has tried Excedrin and Tylenol ibuprofen. Does don't tend to help us much. He does not have any history of any migraine disorder. He does not have any change in vision nausea vomiting associated. He does have poor sleep as he has an newborn at home and 2 other children. He also has been skipping meals because of being busy at work.  Seen in the emergency room a few weeks ago after he had what sounds like recurrence of superficial vein clot. He has had this on his chest wall in the past was evaluated by the [employer] back in 2012. Mammogram ultrasound labs done this was the conclusion they treated with Naprosyn. And his symptoms resolved. He had the same pain felt like a cordlike lesion with swelling on his right chest wall chest x-ray EKG troponins were negative. He was given Naprosyn but he did not take it and the swelling actually went down and so. He does not have a family history that he is aware of of blood clots lupus others varicose vein problems.   His son had some mild hypopigmentation a rash on his abdomen he then noted on his right leg the past few weeks he has a large hypopigmented spot became up it does not itch there is no pain he did not have any other rash prior to the hypopigmentation.    Review Of Systems:  GEN- denies fatigue, fever, weight loss,weakness, recent illness HEENT- denies eye drainage, change in vision, nasal discharge, CVS- denies chest pain, palpitations RESP- denies SOB, cough, wheeze ABD- denies  N/V, change in stools, abd pain GU- denies dysuria, hematuria, dribbling, incontinence MSK- denies joint pain, muscle aches, injury Neuro- denies headache, dizziness, syncope, seizure activity       Objective:    BP 112/78 mmHg  Pulse 82  Temp(Src) 98.6 F (37 C) (Oral)  Resp 12  Ht 5\' 8"  (1.727 m)  Wt 221 lb (100.245 kg)  BMI 33.61 kg/m2 GEN- NAD, alert and oriented x3 HEENT- PERRL, EOMI, non injected sclera, pink conjunctiva, MMM, oropharynx clear Neck- Supple, no thyromegaly CVS- RRR, no murmur RESP-CTAB Skin- Right medial thigh-3x4cm area of hypopigmentation, no rash, not raised or plaque feature, hypopigmented with black light Neuro-CNII-XII intact,no deficits  Ext- no edema, no varicose veins         Assessment & Plan:      Problem List Items Addressed This Visit    None    Visit Diagnoses    Superficial thrombophlebitis    -  Primary    ? based on history now resolved, will check some inflammatory markers, also with headaches, make sure no evidence of vasculitis    Relevant Orders    C-reactive protein    Sedimentation Rate    Basic metabolic panel    ANA    Episodic tension-type headache, not intractable        Trial of imitrex, discussed sleep hygiene and eating on regualar basis, this may resolve  these headaches fairly quickley, no sinus/allergy issues    Relevant Medications    SUMAtriptan (IMITREX) 100 MG tablet    Other Relevant Orders    C-reactive protein    Sedimentation Rate    Basic metabolic panel    Tinea versicolor        possible tinea infection, no other hypopigmented lesions, will try topical antifungal first       Note: This dictation was prepared with Dragon dictation along with smaller phrase technology. Any transcriptional errors that result from this process are unintentional.

## 2015-07-02 NOTE — Patient Instructions (Addendum)
We will call with lab results Try the imitrex for your headaches If you have Clotrimazole at home then apply twice a day, if not get the new prescription of ketaconazole F/U pending results

## 2015-07-03 LAB — ANTI-NUCLEAR AB-TITER (ANA TITER): ANA Titer 1: 1:80 {titer} — ABNORMAL HIGH

## 2015-07-03 LAB — ANA: ANA: POSITIVE — AB

## 2015-07-08 ENCOUNTER — Other Ambulatory Visit

## 2015-07-08 DIAGNOSIS — I809 Phlebitis and thrombophlebitis of unspecified site: Secondary | ICD-10-CM

## 2015-07-08 DIAGNOSIS — R768 Other specified abnormal immunological findings in serum: Secondary | ICD-10-CM

## 2015-07-08 NOTE — Addendum Note (Signed)
Addended by: Milinda AntisURHAM, Kelechi Orgeron F on: 07/08/2015 01:11 PM   Modules accepted: Orders

## 2015-07-08 NOTE — Progress Notes (Signed)
I reviewed Miliary notes Pt had thrombosed superficial vein chest wall, based on ultrasound Treated with naprosyn, no bloodwork in notes Had neg mammogram   Now has positive ANA, will proceed with hypercoagulable work up and get lupus work up

## 2015-07-08 NOTE — Addendum Note (Signed)
Addended by: Milinda AntisURHAM, KAWANTA F on: 07/08/2015 04:02 PM   Modules accepted: Orders

## 2015-07-09 LAB — C3 AND C4
C3 COMPLEMENT: 127 mg/dL (ref 90–180)
C4 Complement: 28 mg/dL (ref 16–47)

## 2015-07-09 LAB — ANTI-SMITH ANTIBODY: ENA SM Ab Ser-aCnc: <1

## 2015-07-09 LAB — ANTI-DNA ANTIBODY, DOUBLE-STRANDED: ds DNA Ab: <1 IU/mL

## 2015-07-11 LAB — RFX DRVVT SCR W/RFLX CONF 1:1 MIX: dRVVT Screen: 44 s (ref <=45)

## 2015-07-11 LAB — RFX PTT-LA W/RFX TO HEX PHASE CONF: PTT-LA SCREEN: 38 s (ref <=40)

## 2015-07-13 LAB — HYPERCOAGULABLE PANEL, COMPREHENSIVE
AntiThromb III Func: 111 % activity (ref 80–120)
Anticardiolipin IgG: <14 [GPL'U]
Beta-2-Glycoprotein I IgA: <9 SAU (ref <=20)
Beta-2-Glycoprotein I IgM: <9 SMU (ref <=20)
PROTEIN C ACTIVITY: 180 % (ref 70–180)
PROTEIN C ANTIGEN: 108 % (ref 70–140)
PROTEIN S ANTIGEN, TOTAL: 113 % (ref 70–140)
Protein S Activity: 106 % (ref 70–150)

## 2015-10-20 ENCOUNTER — Other Ambulatory Visit: Payer: Self-pay | Admitting: Family Medicine

## 2015-10-22 ENCOUNTER — Telehealth: Payer: Self-pay | Admitting: *Deleted

## 2015-10-22 DIAGNOSIS — R0789 Other chest pain: Secondary | ICD-10-CM

## 2015-10-22 NOTE — Telephone Encounter (Signed)
Received call from patient wife, [place].   Reports that patient is voicing C/O intermittent chest wall pain. Reports that pain is similar to previous episodes. Describes intermittent episodes as sharp pain in chest accompanied by HA. Also reports increased sweating at night while sleeping.   Patient refused ED evaluation. Patient wife states that patient denied pain this AM and did go to work.   Patient requesting referral to cards. Noted in last chart notes, MD reports referral to hematology if superficial clot is noted.   MD please advise.

## 2015-10-22 NOTE — Telephone Encounter (Signed)
Does he have the inflammed vein on his chest ? If so I would recommend hematology evaluation and starting the naprosyn back   If not, go ahead and place referral for cardiology, they can do stress test  If pain worsens go to ER

## 2015-10-22 NOTE — Telephone Encounter (Signed)
Prescription sent to pharmacy.

## 2015-10-22 NOTE — Telephone Encounter (Signed)
Call placed to patient and patient wife made aware.   Denies any hard areas of inflammation to veins. States that pain is mediastinal.   Referral to cards placed.

## 2015-11-25 ENCOUNTER — Ambulatory Visit: Admitting: Cardiovascular Disease

## 2015-12-12 ENCOUNTER — Ambulatory Visit (INDEPENDENT_AMBULATORY_CARE_PROVIDER_SITE_OTHER): Admitting: *Deleted

## 2015-12-12 DIAGNOSIS — Z23 Encounter for immunization: Secondary | ICD-10-CM | POA: Diagnosis not present

## 2015-12-12 NOTE — Progress Notes (Signed)
Patient ID: Richard Patrick, male   DOB: 01/11/1986, 30 y.o.   MRN: [id number] Patient seen in office for Influenza Vaccination.   Tolerated IM administration well.   Immunization history updated.

## 2015-12-18 ENCOUNTER — Encounter: Payer: Self-pay | Admitting: Cardiovascular Disease

## 2015-12-18 ENCOUNTER — Ambulatory Visit (INDEPENDENT_AMBULATORY_CARE_PROVIDER_SITE_OTHER): Admitting: Cardiovascular Disease

## 2015-12-18 VITALS — BP 114/80 | HR 69 | Ht 69.0 in | Wt 218.0 lb

## 2015-12-18 DIAGNOSIS — I808 Phlebitis and thrombophlebitis of other sites: Secondary | ICD-10-CM

## 2015-12-18 DIAGNOSIS — R0789 Other chest pain: Secondary | ICD-10-CM | POA: Diagnosis not present

## 2015-12-18 NOTE — Progress Notes (Signed)
Cardiology Consultation Note    Date:  12/18/2015   ID:  Richard JoJulius Mella, DOB September 22, 1985, MRN [id number]  PCP:  Milinda AntisURHAM, KAWANTA, MD  Cardiologist:   Richard FairMihai Geovanie Winnett, MD  Consult requested for: Chest pain Chief Complaint  Patient presents with  . Follow-up    New patient.  . Chest Pain    pain and pressure.    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 2013 he had severe sharp anterior left chest pain associated with a raised painful vein in his anterior chest. He was diagnosed with superficial thrombophlebitis and this resolved gradually over a few weeks. An ultrasound of the chest was performed, but not an echocardiogram. He had a normal mammogram. [place] with right-sided chest pain that was clearly musculoskeletal.  The chest pain he describes now is usually sharp and worsens with movement. When it occurs his left parasternal area is tender to touch. Currently his symptoms are not intact. He denies shortness of breath or pleurisy. The pain is not worsened by lying down. He denies cough, hemoptysis, exertional dyspnea, focal neurological complaints, palpitations, syncope.  Additional complaints include frequent headaches, joint pain involving primarily his knees and lower back and a couple of oral ulcers that have been recurrent. He does snore and scores relatively high on the Epworth Sleepiness Scale (13) but this is likely related to long work hours and 3 small children at home. He does not give other symptoms that suggest obstructive sleep apnea. His BMI is 32, but he is extremely muscular and athletic. Skinfold show very little adipose tissue. His BMI is an overestimation.    Past Medical History:  Diagnosis Date  . Allergy    seasonal    History reviewed. No pertinent surgical  history.  Current Medications: Outpatient Medications Prior to Visit  Medication Sig Dispense Refill  . ketoconazole (NIZORAL) 2 % cream Apply 1 application topically 2 (two) times daily. For 2 weeks 30 g 1  . minocycline (MINOCIN,DYNACIN) 50 MG capsule Take 1 capsule (50 mg total) by mouth 2 (two) times daily. 60 capsule 6  . SUMAtriptan (IMITREX) 100 MG tablet TAKE 1 TABLET BY MOUTH EVERY 2 HOURS AS NEEDED FOR MIGRANE MAY REPEAT IN 2 HOURS IF HEADACHE PERSISTS 10 tablet 0   No facility-administered medications prior to visit.      Allergies:   Review of patient's allergies indicates no known allergies.   Social History   Social History  . Marital status: Married    Spouse name: N/A  . Number of children: N/A  . Years of education: N/A   Social History Main Topics  . Smoking status: Never Smoker  . Smokeless tobacco: Never Used  . Alcohol use 0.0 oz/week     Comment: occasionally  . Drug use: No  . Sexual activity: Yes   Other Topics Concern  . None   Social History Narrative   Entered 05/2014:       Married.    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    Says was with [employer]. Just given this assignment 1 month ago.  Family History:  The patient does not know his family history ROS:   Please see the history of present illness.    ROS All other systems reviewed and are negative.   PHYSICAL EXAM:   VS:  BP 114/80   Pulse 69   Ht 5\' 9"  (1.753 m)   Wt 218 lb (98.9 kg)   BMI 32.19 kg/m    GEN: Very muscular and athletic, well developed, in no acute distress  HEENT: normal  Neck: no JVD, carotid bruits, or masses Cardiac: RRR; no murmurs, rubs, or gallops,no edema  Respiratory:  clear to auscultation bilaterally, normal work of breathing GI: soft, nontender, nondistended, + BS MS: no deformity or atrophy  Skin: warm and dry, no rash Neuro:  Alert and Oriented x 3, Strength  and sensation are intact Psych: euthymic mood, full affect  Wt Readings from Last 3 Encounters:  12/18/15 218 lb (98.9 kg)  07/02/15 221 lb (100.2 kg)  06/05/15 222 lb 5 oz (100.8 kg)      Studies/Labs Reviewed:   EKG:  EKG is ordered today.  The ekg ordered today demonstrates Sinus rhythm without any repolarization abnormalities. QTC 402  Recent Labs: 06/05/2015: Hemoglobin 16.2; Platelets 183 07/02/2015: BUN 19; Creat 1.08; Potassium 4.5; Sodium 140   Lipid Panel    Component Value Date/Time   CHOL 154 05/30/2014 0934   TRIG 64 05/30/2014 0934   HDL 53 05/30/2014 0934   CHOLHDL 2.9 05/30/2014 0934   VLDL 13 05/30/2014 0934   LDLCALC 88 05/30/2014 0934    Additional studies/ records that were reviewed today include:  Notes from Dr. Jeanice Lim and emergency room visit in April 2017  ASSESSMENT:    1. Other chest pain   2. Superficial thrombophlebitis involving other site      PLAN:  In order of problems listed above:  1. Chest wall pain, probably costochondritis. The symptoms are not currently active and his chondrosternal joints are not tender to touch. Reassured him that as long as the pain is reproducible with palpation or movement it is not cardiac in etiology. Cannot exclude this, the sharp chest pain that he has had a last few months might represent pericarditis. Will check an echocardiogram. 2. History of superficial thrombophlebitis. If this diagnosis was accurate, it is an unusual occurrence in a patient his age and in that location. It's probably a very unlikely diagnosis. But I did entertain the possibility of Behcet's syndrome since he also has headaches, joint pain, oral ulcers and other multisystem complaints. I think the likelihood of this diagnosis is low, but I mentioned it to him and asked him to review the potential symptoms. If he thinks he is developing more symptoms of this disorder, consider referral to rheumatology. Note that the serology workup ordered by  his PCP has been mostly negative with the exception of a mildly abnormal ANA.    Medication Adjustments/Labs and Tests Ordered: Current medicines are reviewed at length with the patient today.  Concerns regarding medicines are outlined above.  Medication changes, Labs and Tests ordered today are listed in the Patient Instructions below. Patient Instructions  Your physician has requested that you have an echocardiogram. Echocardiography is a painless test that uses sound waves to create images of your heart. It provides your doctor with information about the size and shape of your heart and how well your heart's chambers and valves are working. This procedure takes approximately one hour. There are no restrictions for this procedure. This will be  performed at our [facility] location - [address], [address].  Dr Royann Shiversroitoru recommends that you follow-up with him as needed.    Signed, Richard FairMihai Luva Metzger, MD  12/18/2015 10:04 AM    [facility] [address], [place], [place]  [address] Phone: [phone]; Fax: [phone]

## 2015-12-18 NOTE — Patient Instructions (Signed)
Your physician has requested that you have an echocardiogram. Echocardiography is a painless test that uses sound waves to create images of your heart. It provides your doctor with information about the size and shape of your heart and how well your heart's chambers and valves are working. This procedure takes approximately one hour. There are no restrictions for this procedure. This will be performed at our [facility] location - [address], [address].  Dr Royann Shiversroitoru recommends that you follow-up with him as needed.

## 2015-12-19 ENCOUNTER — Telehealth: Payer: Self-pay | Admitting: *Deleted

## 2015-12-19 DIAGNOSIS — R768 Other specified abnormal immunological findings in serum: Secondary | ICD-10-CM

## 2015-12-19 DIAGNOSIS — M352 Behcet's disease: Secondary | ICD-10-CM

## 2015-12-19 NOTE — Telephone Encounter (Signed)
Received call from patient wife [place].   States that patient was seen at cards on 12/18/2015. Reports that cardiologist recommended referral to rheumatology.   MD please advise.

## 2015-12-20 NOTE — Telephone Encounter (Signed)
Referral orders placed

## 2015-12-20 NOTE — Telephone Encounter (Signed)
Okay to place referral, Dx- Behcet Syndrome, positive ANA, send over lab results, cardiology note and my noets

## 2016-01-06 ENCOUNTER — Other Ambulatory Visit: Payer: Self-pay

## 2016-01-06 ENCOUNTER — Ambulatory Visit (HOSPITAL_COMMUNITY): Attending: Cardiovascular Disease

## 2016-01-06 DIAGNOSIS — R0789 Other chest pain: Secondary | ICD-10-CM | POA: Diagnosis not present

## 2016-01-16 ENCOUNTER — Telehealth: Payer: Self-pay | Admitting: Cardiovascular Disease

## 2016-01-16 NOTE — Telephone Encounter (Signed)
New message  Pt is calling for results for echo done on 11/20  Please call back and advise

## 2016-01-16 NOTE — Telephone Encounter (Signed)
Patient advised on results and voiced understanding and thanks. Aware we have sent letter.

## 2016-02-12 ENCOUNTER — Encounter: Payer: Self-pay | Admitting: Physician Assistant

## 2016-02-12 ENCOUNTER — Ambulatory Visit (INDEPENDENT_AMBULATORY_CARE_PROVIDER_SITE_OTHER): Admitting: Physician Assistant

## 2016-02-12 VITALS — BP 130/92 | HR 104 | Temp 98.6°F | Resp 16 | Wt 217.0 lb

## 2016-02-12 DIAGNOSIS — J988 Other specified respiratory disorders: Secondary | ICD-10-CM

## 2016-02-12 DIAGNOSIS — B9689 Other specified bacterial agents as the cause of diseases classified elsewhere: Secondary | ICD-10-CM

## 2016-02-12 DIAGNOSIS — R52 Pain, unspecified: Secondary | ICD-10-CM

## 2016-02-12 LAB — INFLUENZA A AND B AG, IMMUNOASSAY
INFLUENZA B ANTIGEN: NOT DETECTED
Influenza A Antigen: NOT DETECTED

## 2016-02-12 MED ORDER — AZITHROMYCIN 250 MG PO TABS: ORAL_TABLET | ORAL | 0 refills | Status: DC

## 2016-02-12 NOTE — Progress Notes (Signed)
Patient ID: Richard Patrick MRN: [id number], DOB: 1985/12/25, 30 y.o. Date of Encounter: 02/12/2016, 4:00 PM    Chief Complaint:  Chief Complaint  Patient presents with  . Emesis  . Chills  . Headache  . Generalized Body Aches     HPI: 30 y.o. year old male presents with above.   He states that his symptoms started Monday 02/10/16. At that time developed cough, blowing his nose, body aches, headache, chills, has thrown up a couple of times after coughing so much, and also has had decreased appetite. States that his wife came here earlier today and was prescribed antibiotics. I reviewed her office visit from this morning with Dr. Jeanice Limurham and she was prescribed antibiotic. He states that his 2 children have also been sick and one of those was diagnosed with an ear infection and is on antibiotics as well. No other complaints or concerns.     Home Meds:   Outpatient Medications Prior to Visit  Medication Sig Dispense Refill  . ketoconazole (NIZORAL) 2 % cream Apply 1 application topically 2 (two) times daily. For 2 weeks 30 g 1  . minocycline (MINOCIN,DYNACIN) 50 MG capsule Take 1 capsule (50 mg total) by mouth 2 (two) times daily. 60 capsule 6  . SUMAtriptan (IMITREX) 100 MG tablet TAKE 1 TABLET BY MOUTH EVERY 2 HOURS AS NEEDED FOR MIGRANE MAY REPEAT IN 2 HOURS IF HEADACHE PERSISTS (Patient not taking: Reported on 02/12/2016) 10 tablet 0   No facility-administered medications prior to visit.     Allergies: No Known Allergies    Review of Systems: See HPI for pertinent ROS. All other ROS negative.    Physical Exam: Blood pressure (!) 130/92, pulse (!) 104, temperature 98.6 F (37 C), temperature source Oral, resp. rate 16, weight 217 lb (98.4 kg), SpO2 98 %., Body mass index is 32.05 kg/m. General:  WNWD AAM. Appears in no acute distress. HEENT: Normocephalic, atraumatic, eyes without discharge, sclera non-icteric, nares are without discharge. Bilateral auditory canals clear,  TM's are without perforation. Left TM with streak of erythema/inflammation. Right TM clear.  Oral cavity moist, posterior pharynx without exudate, erythema, peritonsillar abscess.  Neck: Supple. No thyromegaly. No lymphadenopathy. Lungs: Clear bilaterally to auscultation without wheezes, rales, or rhonchi. Breathing is unlabored. Heart: Regular rhythm. No murmurs, rubs, or gallops. Msk:  Strength and tone normal for age. Extremities/Skin: Warm and dry.  Neuro: Alert and oriented X 3. Moves all extremities spontaneously. Gait is normal. CNII-XII grossly in tact. Psych:  Responds to questions appropriately with a normal affect.   Results for orders placed or performed in visit on 02/12/16  Influenza A and B Ag, Immunoassay  Result Value Ref Range   Source: NASAL    Influenza A Antigen Not Detected Not Detected   Influenza B Antigen Not Detected Not Detected     ASSESSMENT AND PLAN:  30 y.o. year old male with  1. Bacterial respiratory infection His symptoms sound like they could be viral but given that family members have been sick for well over a week and multiple family members sick at this time will go ahead and cover with antibiotics in case there is any bacterial infection-- to get rid of all this infection within the family. He is to take the azithromycin as directed. Follow-up if symptoms do not resolve within 1 week after completion of antibiotic.  Note given for him to be out of work yesterday today and tomorrow with plans to return Friday. F/U if needed. -  azithromycin (ZITHROMAX) 250 MG tablet; Day 1: Take 2 daily. Days 2-5: Take 1 daily.  Dispense: 6 tablet; Refill: 0  2. Body aches  - Influenza A and B Ag, Immunoassay   Signed, [address] Beth BridgetonDixon, [place], [facility] 02/12/2016 4:00 PM

## 2016-03-27 ENCOUNTER — Encounter: Payer: Self-pay | Admitting: Family Medicine

## 2016-03-27 ENCOUNTER — Ambulatory Visit (INDEPENDENT_AMBULATORY_CARE_PROVIDER_SITE_OTHER): Admitting: Family Medicine

## 2016-03-27 VITALS — BP 130/82 | HR 72 | Temp 98.2°F | Resp 14 | Ht 69.0 in | Wt 224.0 lb

## 2016-03-27 DIAGNOSIS — G44209 Tension-type headache, unspecified, not intractable: Secondary | ICD-10-CM | POA: Diagnosis not present

## 2016-03-27 DIAGNOSIS — Z0000 Encounter for general adult medical examination without abnormal findings: Secondary | ICD-10-CM | POA: Diagnosis not present

## 2016-03-27 NOTE — Patient Instructions (Signed)
F/U as needed

## 2016-03-27 NOTE — Progress Notes (Signed)
   Subjective:    Patient ID: Richard Patrick, male    DOB: 1985/09/07, 31 y.o.   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 I diagnosed him with tension headaches back in May 2017. He was given Imitrex at that time. He also changed some things with his diet and his sleep improved as he had a newborn at home and that headache resolved within a month's time. He only took  2 tablets of Imitrex.  No concerns today  Also reviewed Rheumatology note, no signs of Behcets, he had costochondritis causing his chest discomfort that has also resolved.   Review Of Systems:  GEN- denies fatigue, fever, weight loss,weakness, recent illness HEENT- denies eye drainage, change in vision, nasal discharge, CVS- denies chest pain, palpitations RESP- denies SOB, cough, wheeze ABD- denies N/V, change in stools, abd pain GU- denies dysuria, hematuria, dribbling, incontinence MSK- denies joint pain, muscle aches, injury Neuro- denies headache, dizziness, syncope, seizure activity       Objective:    BP 130/82   Pulse 72   Temp 98.2 F (36.8 C) (Oral)   Resp 14   Ht 5\' 9"  (1.753 m)   Wt 224 lb (101.6 kg)   SpO2 98%   BMI 33.08 kg/m  GEN- NAD, alert and oriented x3 HEENT- PERRL, EOMI, non injected sclera, pink conjunctiva, MMM, oropharynx clear Neck- Supple, no thyromegaly, no bruit CVS- RRR, no murmur RESP-CTAB Neuro- CNII-XII in tact, no deficits, motor equal bilat, sensation in tact,DTR symmetric  EXT- No edema Pulses- Radial 2+        Assessment & Plan:      Problem List Items Addressed This Visit    None    Visit Diagnoses    Tension headache    -  Primary   Resolved, no further medications needed. no neurological deficits and no compromise to his occupation noted. Cleared for work, forms completed.   Normal physical exam          Note: This dictation was prepared with  Dragon dictation along with smaller phrase technology. Any transcriptional errors that result from this process are unintentional.

## 2016-03-30 ENCOUNTER — Encounter: Payer: Self-pay | Admitting: *Deleted

## 2016-08-14 ENCOUNTER — Ambulatory Visit (INDEPENDENT_AMBULATORY_CARE_PROVIDER_SITE_OTHER): Admitting: Family Medicine

## 2016-08-14 ENCOUNTER — Encounter: Payer: Self-pay | Admitting: Family Medicine

## 2016-08-14 VITALS — BP 110/62 | HR 68 | Temp 97.9°F | Resp 14 | Ht 69.0 in | Wt 226.0 lb

## 2016-08-14 DIAGNOSIS — G8929 Other chronic pain: Secondary | ICD-10-CM | POA: Diagnosis not present

## 2016-08-14 DIAGNOSIS — M25562 Pain in left knee: Secondary | ICD-10-CM

## 2016-08-14 NOTE — Patient Instructions (Addendum)
[facility] [address] F/U as needed

## 2016-08-14 NOTE — Progress Notes (Signed)
   Subjective:    Patient ID: Richard Patrick, male    DOB: [birth year], 31 y.o.   MRN: [id number]  Patient presents for L Knee Pain (x months- swelling and pain) Here with left knee discomfort and popping. He has reinjured his knee 2 years ago when he was in each hip deployed for the [employer]. That time he was doing of [employer] where he carries heavy backpack and does some drills at that time he had turned around and felt his knee twist at that time he was just treated with anti-inflammatories and exercises and 5 the pain went away. He did have significant swelling directly after the injury. Over the years he occasionally gets some stiffness but the past couple months he has noticed that his knee pops every time he walks up stairs or when he is exercising. He does get some relief after the pop. He has not had any swelling no locking no giving out. He does get stiffness and still takes ibuprofen as needed. He occasionally gets stiffness in the right knee but nothing significant. He denies any back trouble.    Review Of Systems:  GEN- denies fatigue, fever, weight loss,weakness, recent illness HEENT- denies eye drainage, change in vision, nasal discharge, CVS- denies chest pain, palpitations RESP- denies SOB, cough, wheeze ABD- denies N/V, change in stools, abd pain GU- denies dysuria, hematuria, dribbling, incontinence MSK- denies joint pain, muscle aches, injury Neuro- denies headache, dizziness, syncope, seizure activity       Objective:    BP 110/62   Pulse 68   Temp 97.9 F (36.6 C) (Oral)   Resp 14   Ht 5\' 9"  (1.753 m)   Wt 226 lb (102.5 kg)   SpO2 98%   BMI 33.37 kg/m  GEN- NAD, alert and oriented x3 msk- NORMAL Appearance bilat knees, good ROM, ligaments in tact, +crepitus with ROM left knee, no effusion  FROM ankle Ext- no swelling        Assessment & Plan:      Problem List Items Addressed This Visit    None    Visit Diagnoses    Chronic pain of left knee    -   Primary   Based on history, think he may have had meniscal tear in the military and likley developing some early arthritis in knee, obtain xray, no effusion and good ROm, so hold on any injections/interventions unless significant on xray. Prn ibuprofen with food   Relevant Orders   DG Knee Complete 4 Views Left      Note: This dictation was prepared with Dragon dictation along with smaller phrase technology. Any transcriptional errors that result from this process are unintentional.

## 2016-08-26 ENCOUNTER — Ambulatory Visit: Admitting: Physician Assistant

## 2016-11-16 ENCOUNTER — Encounter: Payer: Self-pay | Admitting: Family Medicine

## 2016-11-16 ENCOUNTER — Ambulatory Visit (INDEPENDENT_AMBULATORY_CARE_PROVIDER_SITE_OTHER): Admitting: Family Medicine

## 2016-11-16 VITALS — BP 130/84 | HR 79 | Temp 97.7°F | Resp 16 | Ht 69.0 in | Wt 217.0 lb

## 2016-11-16 DIAGNOSIS — J069 Acute upper respiratory infection, unspecified: Secondary | ICD-10-CM

## 2016-11-16 MED ORDER — HYDROCODONE-HOMATROPINE 5-1.5 MG/5ML PO SYRP: 5.0000 mL | ORAL_SOLUTION | Freq: Three times a day (TID) | ORAL | 0 refills | Status: DC | PRN

## 2016-11-16 NOTE — Progress Notes (Signed)
   Subjective:    Patient ID: Richard Patrick, male    DOB: [birth year], 31 y.o.   MRN: [id number]  HPI  Symptoms began on Thursday with cough, rhinorrhea, head congestion. He also now reports chest congestion and cough productive of yellow and green sputum. He denies any fevers or chills. He denies any shortness of breath or chest pain. He denies any sinus pain, otalgia, or sore throat Past Medical History:  Diagnosis Date  . Allergy    seasonal   No past surgical history on file. Current Outpatient Prescriptions on File Prior to Visit  Medication Sig Dispense Refill  . minocycline (MINOCIN,DYNACIN) 50 MG capsule Take 1 capsule (50 mg total) by mouth 2 (two) times daily. (Patient taking differently: Take 50 mg by mouth 2 (two) times daily as needed. ) 60 capsule 6   No current facility-administered medications on file prior to visit.    No Known Allergies Social History   Social History  . Marital status: Married    Spouse name: N/A  . Number of children: N/A  . Years of education: N/A   Occupational History  . Not on file.   Social History Main Topics  . Smoking status: Never Smoker  . Smokeless tobacco: Never Used  . Alcohol use 0.0 oz/week     Comment: occasionally  . Drug use: No  . Sexual activity: Yes   Other Topics Concern  . Not on file   Social History Narrative   Entered 05/2014:       Married.    3 children--22 month old,  68 y/o,  5 y/o      In Electronics engineer      Currently working as Musician.    Says was with Security Clearance. Just given this assignment 1 month ago.               Review of Systems  All other systems reviewed and are negative.      Objective:   Physical Exam  Constitutional: He appears well-developed and well-nourished. No distress.  HENT:  Right Ear: External ear normal.  Left Ear: External ear normal.  Nose: Mucosal edema and rhinorrhea present. Right sinus exhibits no maxillary sinus tenderness and no frontal sinus  tenderness. Left sinus exhibits no maxillary sinus tenderness and no frontal sinus tenderness.  Mouth/Throat: Oropharynx is clear and moist. No oropharyngeal exudate.  Eyes: Conjunctivae are normal.  Neck: Neck supple.  Cardiovascular: Normal rate, regular rhythm and normal heart sounds.   Pulmonary/Chest: Effort normal and breath sounds normal. No respiratory distress. He has no wheezes. He has no rales.  Abdominal: Soft. Bowel sounds are normal.  Lymphadenopathy:    He has no cervical adenopathy.  Skin: He is not diaphoretic.  Vitals reviewed.         Assessment & Plan:  Viral upper respiratory tract infection  Patient has a viral upper respiratory infection. Begin norel ad 1 tablet every 4-6 hours as needed for headache congestion, he can use Hycodan 1 teaspoon every 8 hours as needed for cough. I cautioned the patient about using the medication and driving. Recommended that it mainly be used at night to help him sleep

## 2016-11-30 ENCOUNTER — Telehealth: Payer: Self-pay | Admitting: *Deleted

## 2016-11-30 MED ORDER — AZITHROMYCIN 250 MG PO TABS: ORAL_TABLET | ORAL | 0 refills | Status: DC

## 2016-11-30 NOTE — Telephone Encounter (Signed)
zpack

## 2016-11-30 NOTE — Telephone Encounter (Signed)
Received call from patient wife, [place].    Reports that illness has continued. States that he continues to have cough and fatigue. Patient is requesting ABTx.   MD please advise.

## 2016-11-30 NOTE — Telephone Encounter (Signed)
Call placed to patient and patient wife Richard Patrick made aware.   Prescription sent to pharmacy.

## 2016-12-21 ENCOUNTER — Ambulatory Visit

## 2016-12-22 ENCOUNTER — Ambulatory Visit

## 2016-12-23 ENCOUNTER — Ambulatory Visit (INDEPENDENT_AMBULATORY_CARE_PROVIDER_SITE_OTHER): Admitting: Family Medicine

## 2016-12-23 DIAGNOSIS — Z23 Encounter for immunization: Secondary | ICD-10-CM

## 2017-02-01 ENCOUNTER — Telehealth: Payer: Self-pay | Admitting: Family Medicine

## 2017-02-01 NOTE — Telephone Encounter (Signed)
Wife called and he baby have pinkeye.  He is not have any fever no cough congestion or difficulty breathing.  Polytrim 1 drop 4 times daily to affected eye for 7 days sent.

## 2017-02-15 ENCOUNTER — Ambulatory Visit (INDEPENDENT_AMBULATORY_CARE_PROVIDER_SITE_OTHER): Admitting: Physician Assistant

## 2017-02-15 ENCOUNTER — Other Ambulatory Visit: Payer: Self-pay

## 2017-02-15 ENCOUNTER — Encounter: Payer: Self-pay | Admitting: Physician Assistant

## 2017-02-15 VITALS — BP 118/84 | HR 92 | Temp 97.9°F | Resp 18 | Wt 224.4 lb

## 2017-02-15 DIAGNOSIS — J0100 Acute maxillary sinusitis, unspecified: Secondary | ICD-10-CM

## 2017-02-15 MED ORDER — PREDNISONE 20 MG PO TABS: ORAL_TABLET | ORAL | 0 refills | Status: DC

## 2017-02-15 MED ORDER — AMOXICILLIN-POT CLAVULANATE 875-125 MG PO TABS: 1.0000 | ORAL_TABLET | Freq: Two times a day (BID) | ORAL | 0 refills | Status: DC

## 2017-02-15 MED ORDER — AMOXICILLIN-POT CLAVULANATE 875-125 MG PO TABS: 1.0000 | ORAL_TABLET | Freq: Two times a day (BID) | ORAL | 0 refills | Status: AC

## 2017-02-15 NOTE — Progress Notes (Signed)
    Patient ID: Idelle JoJulius Macias MRN: [id number], DOB: [birth year], 31 y.o. Date of Encounter: 02/15/2017, 11:27 AM    Chief Complaint:  Chief Complaint  Patient presents with  . Nasal Congestion    x3weeks   . Dental Pain  . Leg Pain     HPI: 31 y.o. year old male presents with above.   He reports that he has been having congestion in his head and nose.  Says that it is "even causing his teeth to feel sore" and points to his upper teeth.   Says that it is hard to breathe at night because of the congestion.   States that he has had no congestion in his chest.  Everything has stayed in his sinuses head and nose region.  Has had no known fever.  No significant sore throat.     Home Meds:   Outpatient Medications Prior to Visit  Medication Sig Dispense Refill  . azithromycin (ZITHROMAX) 250 MG tablet Take (2) tablets by mouth on day 1, then take (1) tablet by mouth on days 2-5. 6 tablet 0  . HYDROcodone-homatropine (HYCODAN) 5-1.5 MG/5ML syrup Take 5 mLs by mouth every 8 (eight) hours as needed for cough. 120 mL 0  . minocycline (MINOCIN,DYNACIN) 50 MG capsule Take 1 capsule (50 mg total) by mouth 2 (two) times daily. (Patient taking differently: Take 50 mg by mouth 2 (two) times daily as needed. ) 60 capsule 6   No facility-administered medications prior to visit.     Allergies: No Known Allergies    Review of Systems: See HPI for pertinent ROS. All other ROS negative.    Physical Exam: Blood pressure 118/84, pulse 92, temperature 97.9 F (36.6 C), temperature source Oral, resp. rate 18, weight 101.8 kg (224 lb 6.4 oz), SpO2 98 %., Body mass index is 33.14 kg/m. General:  WNWD AAM. Appears in no acute distress. HEENT: Normocephalic, atraumatic, eyes without discharge, sclera non-icteric, nares are without discharge. Bilateral auditory canals clear, TM's are without perforation. Right TM with erythema. Left TM clear.  Oral cavity moist, posterior pharynx without exudate,  erythema, peritonsillar abscess.  Has tenderness with percussion to right maxillary sinus. Neck: Supple. No thyromegaly. No lymphadenopathy. Lungs: Clear bilaterally to auscultation without wheezes, rales, or rhonchi. Breathing is unlabored. Heart: Regular rhythm. No murmurs, rubs, or gallops. Msk:  Strength and tone normal for age. Extremities/Skin: Warm and dry.  Neuro: Alert and oriented X 3. Moves all extremities spontaneously. Gait is normal. CNII-XII grossly in tact. Psych:  Responds to questions appropriately with a normal affect.     ASSESSMENT AND PLAN:  31 y.o. year old male with  1. Acute non-recurrent maxillary sinusitis He is to take the antibiotic and the prednisone as directed.  Follow-up if symptoms do not resolve after completion of these.  Offered note for work but he states he does not need any documentation. - predniSONE (DELTASONE) 20 MG tablet; Take 3 daily for 2 days, then 2 daily for 2 days, then 1 daily for 2 days.  Dispense: 12 tablet; Refill: 0 - amoxicillin-clavulanate (AUGMENTIN) 875-125 MG tablet; Take 1 tablet by mouth 2 (two) times daily for 14 days.  Dispense: 28 tablet; Refill: 0   Signed, [address] Batterman [place] Park[place], [place], [facility] 02/15/2017 11:27 AM

## 2017-04-11 ENCOUNTER — Emergency Department (HOSPITAL_COMMUNITY)
Admission: EM | Admit: 2017-04-11 | Discharge: 2017-04-12 | Disposition: A | Discharge status: Home / Self Care | Attending: Emergency Medicine | Admitting: Emergency Medicine

## 2017-04-11 ENCOUNTER — Encounter (HOSPITAL_COMMUNITY): Payer: Self-pay

## 2017-04-11 ENCOUNTER — Emergency Department (HOSPITAL_COMMUNITY)

## 2017-04-11 ENCOUNTER — Other Ambulatory Visit: Payer: Self-pay

## 2017-04-11 DIAGNOSIS — M79672 Pain in left foot: Secondary | ICD-10-CM | POA: Insufficient documentation

## 2017-04-11 DIAGNOSIS — Z79899 Other long term (current) drug therapy: Secondary | ICD-10-CM | POA: Diagnosis not present

## 2017-04-11 MED ORDER — IBUPROFEN 800 MG PO TABS: 800.0000 mg | ORAL_TABLET | Freq: Three times a day (TID) | ORAL | 0 refills | Status: DC

## 2017-04-11 NOTE — Discharge Instructions (Signed)
Take ibuprofen 3 times a day with meals.  Do not take other anti-inflammatories at the same time open (Advil, Motrin, naproxen, Aleve). You may supplement with Tylenol if you need further pain control. Use ice packs  to help with pain and inflammation. Try to rest your foot as much as possible. Follow up with the podiatrist if your symptoms are not improving in 1 week. Return to the ER if the pain worsens, you have numbness, your foot changes color, or any new or concerning symptoms.

## 2017-04-11 NOTE — ED Triage Notes (Signed)
Pt states that he went out for a run and his L foot began to hurt on the bottom.

## 2017-04-12 NOTE — ED Provider Notes (Signed)
[facility]   CSN: [id number] Arrival date & time: 04/11/17  2147     History   Chief Complaint Chief Complaint  Patient presents with  . Foot Pain    HPI Richard Patrick is a 32 y.o. male presenting for evaluation of L foot pain.   Patient states that he developed left foot pain on Wednesday.  Tuesday evening, he ran approximately 7 miles, when he normally runs about 3.  His foot pain has been persistent, worse with movement of the ankle and walking.  Pain is of the plantar aspect of the medial foot, more towards the lateral side.  He denies new shoes or new activities.  He denies fall, trauma, or injury.  He denies history of foot pain in the past.  Pain does not extend past the calcaneus.  He denies swelling or redness of the foot.  He has been taking ibuprofen with no improvement of the pain.  He has not taken anything else for pain.  He has no other medical problems, does not take medications daily. Pain at rest is described as a throbbing, is shaper with movement. No radiation. He denies numbness or tingling.   HPI  Past Medical History:  Diagnosis Date  . Allergy    seasonal    Patient Active Problem List   Diagnosis Date Noted  . Acne vulgaris 09/19/2014  . Vitamin D deficiency 06/05/2014  . Allergic rhinitis 05/28/2014    History reviewed. No pertinent surgical history.     Home Medications    Prior to Admission medications   Medication Sig Start Date End Date Taking? Authorizing Provider  ibuprofen (ADVIL,MOTRIN) 800 MG tablet Take 1 tablet (800 mg total) by mouth 3 (three) times daily with meals. 04/11/17   Nandini Bogdanski, PA-C  predniSONE (DELTASONE) 20 MG tablet Take 3 daily for 2 days, then 2 daily for 2 days, then 1 daily for 2 days. 02/15/17   Dorena Bodoixon, Mary B, PA-C    Family History No family history on file.  Social History Social History   Tobacco Use  . Smoking status: Never Smoker  .  Smokeless tobacco: Never Used  Substance Use Topics  . Alcohol use: Yes    Alcohol/week: 0.0 oz    Comment: occasionally  . Drug use: No     Allergies   Patient has no known allergies.   Review of Systems Review of Systems  Musculoskeletal: Positive for arthralgias.  Neurological: Negative for numbness.  Hematological: Does not bruise/bleed easily.     Physical Exam Updated Vital Signs BP 124/74   Pulse 82   Temp 97.7 F (36.5 C) (Oral)   Resp 18   Ht 5\' 9"  (1.753 m)   Wt 100.7 kg (222 lb)   SpO2 98%   BMI 32.78 kg/m   Physical Exam  Constitutional: He is oriented to person, place, and time. He appears well-developed and well-nourished. No distress.  HENT:  Head: Normocephalic and atraumatic.  Eyes: EOM are normal.  Neck: Normal range of motion.  Pulmonary/Chest: Effort normal.  Abdominal: He exhibits no distension.  Musculoskeletal: Normal range of motion. He exhibits tenderness.  No obvious deformity of the foot.  Tenderness to palpation along the plantar aspect of the left middle foot, lateral more than medial.  No swelling, redness, or warmth.  Pedal pulses intact bilaterally.  Good cap refill.  Increased pain with active range of motion, no pain with passive range of motion.  Pain does  not extend to the calcaneus or Achilles.  Neurological: He is alert and oriented to person, place, and time. No sensory deficit.  Skin: Skin is warm. No rash noted.  Psychiatric: He has a normal mood and affect.  Nursing note and vitals reviewed.    ED Treatments / Results  Labs (all labs ordered are listed, but only abnormal results are displayed) Labs Reviewed - No data to display  EKG  EKG Interpretation None       Radiology Dg Foot Complete Left  Result Date: 04/11/2017 CLINICAL DATA:  Left foot pain EXAM: LEFT FOOT - COMPLETE 3+ VIEW COMPARISON:  None. FINDINGS: There is no evidence of fracture or dislocation. There is no evidence of arthropathy or other focal  bone abnormality. Soft tissues are unremarkable. IMPRESSION: Negative. Electronically Signed   By: Charlett Nose M.D.   On: 04/11/2017 22:27    Procedures Procedures (including critical care time)  Medications Ordered in ED Medications - No data to display   Initial Impression / Assessment and Plan / ED Course  I have reviewed the triage vital signs and the nursing notes.  Pertinent labs & imaging results that were available during my care of the patient were reviewed by me and considered in my medical decision making (see chart for details).     Pt presenting with left foot pain.  Physical exam reassuring, he is neurovascularly intact.  Night before symptoms began, patient ran more than normal.  Likely tendinitis.  X-ray reviewed and interpreted by me, shows no sign of fracture or dislocation.  As there is no warmth, redness, swelling, doubt infection. Discussed findings with pt. Discussed treatment with NSAIDs, ice, and rest.  Patient given information for follow-up for podiatry as needed.  At this time, patient appears safe for discharge.  Return precautions given.  Patient states he understands and agrees plan.   Final Clinical Impressions(s) / ED Diagnoses   Final diagnoses:  Left foot pain    ED Discharge Orders        Ordered    ibuprofen (ADVIL,MOTRIN) 800 MG tablet  3 times daily with meals     04/11/17 2349       Sweta Halseth, PA-C 04/12/17 0046    Geoffery Lyons, MD 04/12/17 [phone]

## 2017-05-20 IMAGING — DX DG CHEST 2V
2 series · 2 of 2 positions shown · non-contrast
Comparison: None.

CLINICAL DATA: Left chest pain for 2 weeks

EXAM:
CHEST  2 VIEW

[chest pa]
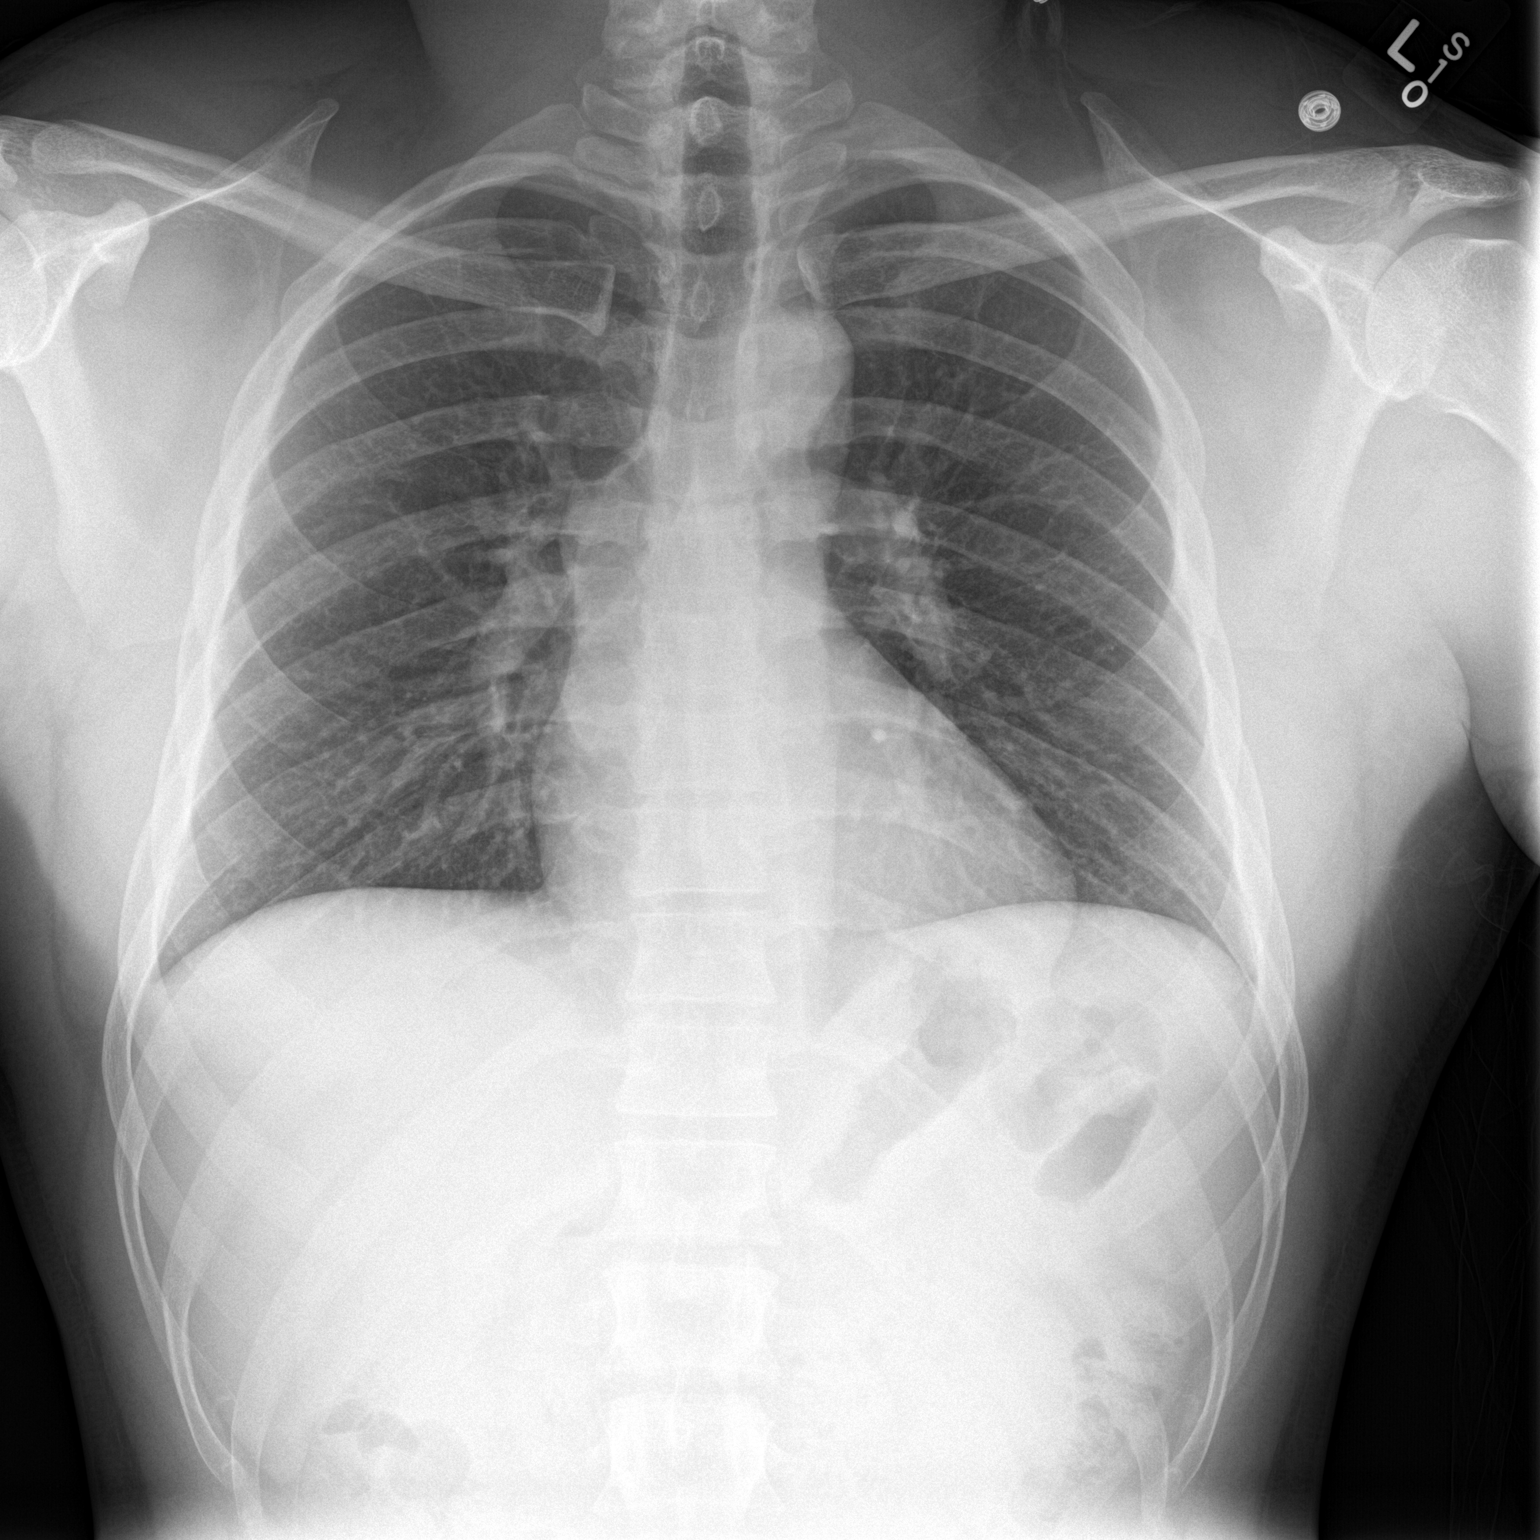

[chest lat]
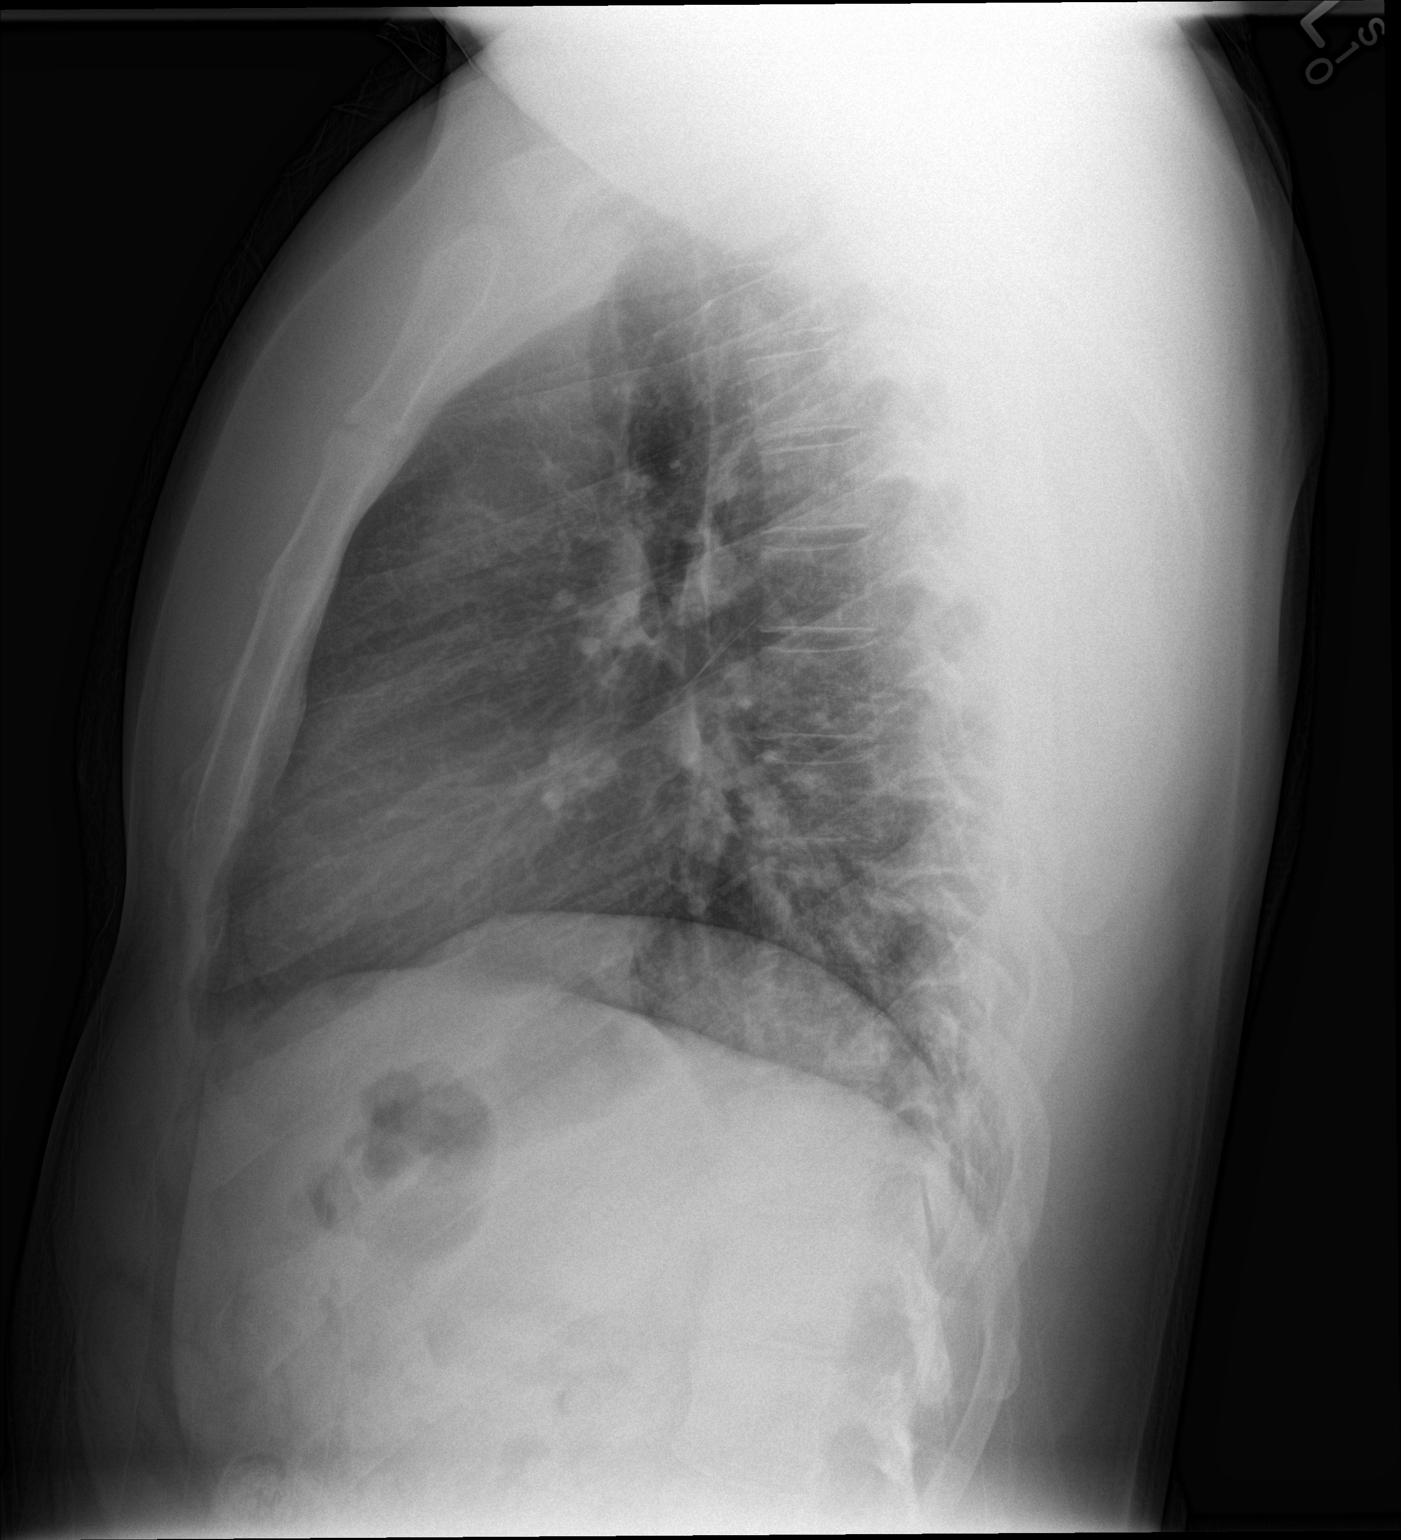

[2 of 2 positions shown; findings below may reference images not displayed]

FINDINGS: The heart size and mediastinal contours are within normal limits.
Both lungs are clear. The visualized skeletal structures are
unremarkable.
IMPRESSION: No active cardiopulmonary disease.

## 2017-06-26 ENCOUNTER — Encounter (HOSPITAL_COMMUNITY): Payer: Self-pay

## 2017-06-26 ENCOUNTER — Other Ambulatory Visit: Payer: Self-pay

## 2017-06-26 ENCOUNTER — Emergency Department (HOSPITAL_COMMUNITY)
Admission: EM | Admit: 2017-06-26 | Discharge: 2017-06-26 | Disposition: A | Discharge status: Home / Self Care | Attending: Emergency Medicine | Admitting: Emergency Medicine

## 2017-06-26 DIAGNOSIS — Y929 Unspecified place or not applicable: Secondary | ICD-10-CM | POA: Diagnosis not present

## 2017-06-26 DIAGNOSIS — Y9389 Activity, other specified: Secondary | ICD-10-CM | POA: Diagnosis not present

## 2017-06-26 DIAGNOSIS — W270XXA Contact with workbench tool, initial encounter: Secondary | ICD-10-CM | POA: Diagnosis not present

## 2017-06-26 DIAGNOSIS — Z79899 Other long term (current) drug therapy: Secondary | ICD-10-CM | POA: Diagnosis not present

## 2017-06-26 DIAGNOSIS — Y999 Unspecified external cause status: Secondary | ICD-10-CM | POA: Diagnosis not present

## 2017-06-26 DIAGNOSIS — S51812A Laceration without foreign body of left forearm, initial encounter: Secondary | ICD-10-CM

## 2017-06-26 DIAGNOSIS — S59912A Unspecified injury of left forearm, initial encounter: Secondary | ICD-10-CM | POA: Diagnosis present

## 2017-06-26 MED ORDER — LIDOCAINE-EPINEPHRINE (PF) 2 %-1:200000 IJ SOLN
20.0000 mL | Freq: Once | INTRAMUSCULAR | Status: AC
  Administered 2017-06-26: 20 mL via INTRADERMAL
  Filled 2017-06-26: qty 20

## 2017-06-26 MED ORDER — ACETAMINOPHEN 500 MG PO TABS
1000.0000 mg | ORAL_TABLET | Freq: Once | ORAL | Status: AC
  Administered 2017-06-26: 1000 mg via ORAL
  Filled 2017-06-26: qty 2

## 2017-06-26 NOTE — Discharge Instructions (Addendum)
Please have your stitches removed in 7days.   Please return to the ER if you have any new or worsening symptoms like fever >100.90F, redness or swelling.

## 2017-06-26 NOTE — ED Notes (Signed)
Updated on wait per patient request.

## 2017-06-26 NOTE — ED Triage Notes (Signed)
Pt endorses cutting limbs with a hand saw and accidentally cut his left forearm, pt has 3 inch lac to left forearm. VSS.

## 2017-06-26 NOTE — ED Provider Notes (Signed)
[facility] EMERGENCY DEPARTMENT Provider Note   CSN: [id number] Arrival date & time: 06/26/17  1828     History   Chief Complaint Chief Complaint  Patient presents with  . Laceration    HPI Richard Patrick is a 32 y.o. male.  HPI   Richard Patrick is a 32yo male with a history of allergies who presents emergency department for evaluation of forearm laceration.  Patient reports that he was using a hand saw earlier today and accidentally cut the inside of his left forearm.  This occurred approximately 6 PM.  He is able to achieve hemostasis with pressure.  Reports that he now has mild burning sensation over the laceration.  Denies numbness, weakness, fever, chills, wounds elsewhere.  He took some Tylenol for pain control earlier which has helped somewhat.  Reports that he got his last Tdap a year ago.  Past Medical History:  Diagnosis Date  . Allergy    seasonal    Patient Active Problem List   Diagnosis Date Noted  . Acne vulgaris 09/19/2014  . Vitamin D deficiency 06/05/2014  . Allergic rhinitis 05/28/2014    History reviewed. No pertinent surgical history.      Home Medications    Prior to Admission medications   Medication Sig Start Date End Date Taking? Authorizing Provider  ibuprofen (ADVIL,MOTRIN) 800 MG tablet Take 1 tablet (800 mg total) by mouth 3 (three) times daily with meals. 04/11/17   Caccavale, Sophia, PA-C  predniSONE (DELTASONE) 20 MG tablet Take 3 daily for 2 days, then 2 daily for 2 days, then 1 daily for 2 days. 02/15/17   Dorena Bodo, PA-C    Family History History reviewed. No pertinent family history.  Social History Social History   Tobacco Use  . Smoking status: Never Smoker  . Smokeless tobacco: Current User    Types: Snuff  Substance Use Topics  . Alcohol use: Yes    Alcohol/week: 0.0 oz    Comment: occasionally  . Drug use: No     Allergies   Patient has no known allergies.   Review of Systems Review of  Systems  Constitutional: Negative for chills and fever.  Skin: Positive for wound (left forearm). Negative for color change.  Neurological: Negative for weakness and numbness.     Physical Exam Updated Vital Signs BP 127/77 (BP Location: Right Arm)   Pulse 90   Temp 98 F (36.7 C) (Oral)   Resp 16   Ht  (1.753 m)   Wt 95.3 kg (210 lb)   SpO2 100%   BMI 31.01 kg/m   Physical Exam  Constitutional: He appears well-developed and well-nourished. No distress.  HENT:  Head: Normocephalic and atraumatic.  Eyes: Right eye exhibits no discharge. Left eye exhibits no discharge.  Pulmonary/Chest: Effort normal. No respiratory distress.  Musculoskeletal:  12 cm laceration over the left forearm.  No surrounding erythema, warmth or induration.  Full active ROM of left wrist and elbow.  Sensation to light touch intact beyond wound.  Neurological: He is alert. Coordination normal.  Skin: Skin is warm and dry. Capillary refill takes less than 2 seconds. He is not diaphoretic.  Psychiatric: He has a normal mood and affect. His behavior is normal.  Nursing note and vitals reviewed.    ED Treatments / Results  Labs (all labs ordered are listed, but only abnormal results are displayed) Labs Reviewed - No data to display  EKG None  Radiology No results found.  Procedures .Marland KitchenLaceration Repair Date/Time: 06/26/2017 11:33 PM Performed by: Kellie Shropshire, PA-C Authorized by: Kellie Shropshire, PA-C   Consent:    Consent obtained:  Verbal and emergent situation   Consent given by:  Patient   Risks discussed:  Infection, pain, poor cosmetic result, poor wound healing and retained foreign body   Alternatives discussed:  No treatment and delayed treatment Anesthesia (see MAR for exact dosages):    Anesthesia method:  Local infiltration   Local anesthetic:  Lidocaine 2% WITH epi Laceration details:    Location:  Shoulder/arm   Shoulder/arm location:  L lower arm   Length (cm):   12   Depth (mm):  5 Repair type:    Repair type:  Simple Pre-procedure details:    Preparation:  Patient was prepped and draped in usual sterile fashion Exploration:    Hemostasis achieved with:  Direct pressure   Wound exploration: wound explored through full range of motion and entire depth of wound probed and visualized     Contaminated: no   Treatment:    Area cleansed with:  Betadine   Amount of cleaning:  Extensive   Irrigation solution:  Sterile saline   Irrigation volume:  1L   Irrigation method:  Pressure wash Skin repair:    Repair method:  Sutures   Suture size:  5-0   Suture material:  Prolene   Suture technique:  Simple interrupted   Number of sutures:  14 Approximation:    Approximation:  Close Post-procedure details:    Dressing:  Non-adherent dressing   Patient tolerance of procedure:  Tolerated well, no immediate complications   (including critical care time)  Medications Ordered in ED Medications  acetaminophen (TYLENOL) tablet 1,000 mg (1,000 mg Oral Given 06/26/17 2107)  lidocaine-EPINEPHrine (XYLOCAINE W/EPI) 2 %-1:200000 (PF) injection 20 mL (20 mLs Intradermal Given by Other 06/26/17 2237)     Initial Impression / Assessment and Plan / ED Course  I have reviewed the triage vital signs and the nursing notes.  Pertinent labs & imaging results that were available during my care of the patient were reviewed by me and considered in my medical decision making (see chart for details).     Pressure irrigation performed. Wound explored and base of wound visualized in a bloodless field without evidence of foreign body.  Laceration occurred < 8 hours prior to repair which was well tolerated. Tdap up to date.  Pt has no comorbidities to effect normal wound healing. Pt discharged without antibiotics.  Discussed suture home care with patient and answered questions. Pt to follow-up for wound check and suture removal in 7 days; they are to return to the ED sooner for  signs of infection. Pt is hemodynamically stable with no complaints prior to dc.   Final Clinical Impressions(s) / ED Diagnoses   Final diagnoses:  Laceration of left forearm, initial encounter    ED Discharge Orders    None       Lawrence Marseilles 06/27/17 0148    Tegeler, Canary Brim, MD 06/27/17 0222

## 2017-07-03 ENCOUNTER — Ambulatory Visit (HOSPITAL_COMMUNITY): Admission: EM | Admit: 2017-07-03 | Discharge: 2017-07-03 | Disposition: A | Discharge status: Home / Self Care

## 2017-07-03 ENCOUNTER — Encounter (HOSPITAL_COMMUNITY): Payer: Self-pay | Admitting: Emergency Medicine

## 2017-07-03 DIAGNOSIS — Z4802 Encounter for removal of sutures: Secondary | ICD-10-CM

## 2017-07-03 DIAGNOSIS — S51812A Laceration without foreign body of left forearm, initial encounter: Secondary | ICD-10-CM

## 2017-07-03 NOTE — ED Triage Notes (Signed)
Pt here for suture removal to left forearm

## 2017-07-06 ENCOUNTER — Ambulatory Visit: Admitting: Family Medicine

## 2017-07-06 ENCOUNTER — Telehealth: Payer: Self-pay | Admitting: *Deleted

## 2017-07-06 DIAGNOSIS — Z630 Problems in relationship with spouse or partner: Secondary | ICD-10-CM

## 2017-07-06 NOTE — Telephone Encounter (Signed)
Call placed to patient wife in regards to patient appointment for therapy.   Requested MD to refer to a qualified marriage counselor. states that referral must be under spouse's name.

## 2017-07-06 NOTE — Telephone Encounter (Signed)
Okay to place referral,  Cancel referral for [place], and put under husband's name due to insurance for the marriage therapy

## 2017-07-07 NOTE — Telephone Encounter (Signed)
Spoke with patient's wife and she stated that she can keep her referral but her husband also needs a referral to [facility] and so he can be seen. Referral placed

## 2017-07-07 NOTE — Addendum Note (Signed)
Addended by: Theodora Blow R on: 07/07/2017 10:06 AM   Modules accepted: Orders

## 2017-07-07 NOTE — Telephone Encounter (Signed)
Left message return call to get more information from patient.

## 2017-07-13 ENCOUNTER — Ambulatory Visit: Admitting: Family Medicine

## 2017-09-08 ENCOUNTER — Encounter: Payer: Self-pay | Admitting: Family Medicine

## 2017-09-08 ENCOUNTER — Ambulatory Visit (INDEPENDENT_AMBULATORY_CARE_PROVIDER_SITE_OTHER): Admitting: Family Medicine

## 2017-09-08 VITALS — BP 110/62 | HR 58 | Temp 98.0°F | Resp 12 | Ht 69.0 in | Wt 213.0 lb

## 2017-09-08 DIAGNOSIS — Z1321 Encounter for screening for nutritional disorder: Secondary | ICD-10-CM

## 2017-09-08 DIAGNOSIS — Z0000 Encounter for general adult medical examination without abnormal findings: Secondary | ICD-10-CM | POA: Diagnosis not present

## 2017-09-08 LAB — CBC WITH DIFFERENTIAL/PLATELET
BASOS ABS: 73 {cells}/uL (ref 0–200)
Basophils Relative: 1.3 %
EOS PCT: 2.3 %
Eosinophils Absolute: 129 cells/uL (ref 15–500)
HCT: 46.6 % (ref 38.5–50.0)
Hemoglobin: 16.3 g/dL (ref 13.2–17.1)
Lymphs Abs: 2190 cells/uL (ref 850–3900)
MCH: 31 pg (ref 27.0–33.0)
MCHC: 35 g/dL (ref 32.0–36.0)
MCV: 88.6 fL (ref 80.0–100.0)
MPV: 11.7 fL (ref 7.5–12.5)
Monocytes Relative: 7.6 %
NEUTROS ABS: 2783 {cells}/uL (ref 1500–7800)
NEUTROS PCT: 49.7 %
PLATELETS: 167 10*3/uL (ref 140–400)
RBC: 5.26 10*6/uL (ref 4.20–5.80)
RDW: 13 % (ref 11.0–15.0)
Total Lymphocyte: 39.1 %
WBC: 5.6 10*3/uL (ref 3.8–10.8)
WBCMIX: 426 {cells}/uL (ref 200–950)

## 2017-09-08 LAB — COMPREHENSIVE METABOLIC PANEL
AG Ratio: 2 (calc) (ref 1.0–2.5)
ALT: 16 U/L (ref 9–46)
AST: 33 U/L (ref 10–40)
Albumin: 4.9 g/dL (ref 3.6–5.1)
Alkaline phosphatase (APISO): 66 U/L (ref 40–115)
BUN: 19 mg/dL (ref 7–25)
CO2: 27 mmol/L (ref 20–32)
CREATININE: 1.08 mg/dL (ref 0.60–1.35)
Calcium: 9.7 mg/dL (ref 8.6–10.3)
Chloride: 106 mmol/L (ref 98–110)
GLUCOSE: 89 mg/dL (ref 65–99)
Globulin: 2.5 g/dL (calc) (ref 1.9–3.7)
Potassium: 4.3 mmol/L (ref 3.5–5.3)
Sodium: 139 mmol/L (ref 135–146)
Total Bilirubin: 0.4 mg/dL (ref 0.2–1.2)
Total Protein: 7.4 g/dL (ref 6.1–8.1)

## 2017-09-08 LAB — LIPID PANEL
CHOL/HDL RATIO: 2.7 (calc) (ref <5.0)
Cholesterol: 131 mg/dL (ref <200)
HDL: 49 mg/dL (ref >40)
LDL Cholesterol (Calc): 65 mg/dL (calc)
NON-HDL CHOLESTEROL (CALC): 82 mg/dL (ref <130)
TRIGLYCERIDES: 86 mg/dL (ref <150)

## 2017-09-08 NOTE — Patient Instructions (Addendum)
F/U 1 year for physical I recommend eye visit once a year I recommend dental visit every 6 months Goal is to  Exercise 30 minutes 5 days a week We will call with lab results    

## 2017-09-08 NOTE — Progress Notes (Signed)
   Subjective:    Patient ID: Richard Patrick, male    DOB: Feb 21, 1985, 32 y.o.   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  No current medications  history reviewed  Due for fasting labs  Discussed HIV screening Immunizations UTD   Exercising regulary   Told in past he has vitamin Deficiency  Depression/CAGE screen is negative    Getting [facility] going well, he is going with wife and alone, working on communciation   Review Of Systems:  GEN- denies fatigue, fever, weight loss,weakness, recent illness HEENT- denies eye drainage, change in vision, nasal discharge, CVS- denies chest pain, palpitations RESP- denies SOB, cough, wheeze ABD- denies N/V, change in stools, abd pain GU- denies dysuria, hematuria, dribbling, incontinence MSK- denies joint pain, muscle aches, injury Neuro- denies headache, dizziness, syncope, seizure activity       Objective:    BP 110/62   Pulse (!) 58   Temp 98 F (36.7 C) (Oral)   Resp 12   Ht 5\' 9"  (1.753 m)   Wt 213 lb (96.6 kg)   SpO2 98%   BMI 31.45 kg/m  GEN- NAD, alert and oriented x3 HEENT- PERRL, EOMI, non injected sclera, pink conjunctiva, MMM, oropharynx clear Neck- Supple, no thyromegaly CVS- RRR, no murmur RESP-CTAB ABD-NABS,soft,NT,ND EXT- No edema Pulses- Radial, DP- 2+        Assessment & Plan:      Problem List Items Addressed This Visit    None    Visit Diagnoses    Routine general medical examination at a health care facility    -  Primary   CPE done, fasting labs, prevention UTD, no concerns today, he was wearing his military gear, so weight likely much less continue marriage therapy   Relevant Orders   CBC with Differential/Platelet   Comprehensive metabolic panel   Lipid panel   Encounter for vitamin deficiency screening       Relevant Orders   Vitamin D, 25-hydroxy      Note:  This dictation was prepared with Dragon dictation along with smaller phrase technology. Any transcriptional errors that result from this process are unintentional.

## 2017-09-09 LAB — VITAMIN D 25 HYDROXY (VIT D DEFICIENCY, FRACTURES): Vit D, 25-Hydroxy: 34 ng/mL (ref 30–100)

## 2017-10-19 ENCOUNTER — Encounter: Payer: Self-pay | Admitting: Family Medicine

## 2017-10-19 ENCOUNTER — Ambulatory Visit (INDEPENDENT_AMBULATORY_CARE_PROVIDER_SITE_OTHER): Admitting: Family Medicine

## 2017-10-19 VITALS — BP 108/64 | HR 76 | Temp 98.0°F | Resp 14 | Ht 69.0 in | Wt 212.0 lb

## 2017-10-19 DIAGNOSIS — A084 Viral intestinal infection, unspecified: Secondary | ICD-10-CM | POA: Diagnosis not present

## 2017-10-19 MED ORDER — PROMETHAZINE HCL 25 MG PO TABS: 25.0000 mg | ORAL_TABLET | Freq: Three times a day (TID) | ORAL | 0 refills | Status: DC | PRN

## 2017-10-19 NOTE — Progress Notes (Signed)
Subjective:    Patient ID: Richard Patrick, male    DOB: 1985/07/02, 32 y.o.   MRN: [id number]  HPI Symptoms began this morning with a low-grade fever, nausea and vomiting and a headache.  The headache is diffuse, nonpulsatile, no photophobia, no meningismus.  The headache is mild.  He denies any rhinorrhea cough or sore throat.  He feels nauseated.  He is been able to keep down water but he has not tried any food since.  He has had one episode of nonbloody diarrhea.  He denies any melena.  He denies any hematochezia.  He denies any recent travel.  He denies any food that he is concerned may have spoiled or been subject to food poisoning.  His wife and 2 sons have all been sick last week with viral infections  Past Medical History:  Diagnosis Date  . Allergy    seasonal   No past surgical history on file. No current outpatient medications on file prior to visit.   No current facility-administered medications on file prior to visit.    No Known Allergies Social History   Socioeconomic History  . Marital status: Married    Spouse name: Not on file  . Number of children: Not on file  . Years of education: Not on file  . Highest education level: Not on file  Occupational History  . Not on file  Social Needs  . Financial resource strain: Not on file  . Food insecurity:    Worry: Not on file    Inability: Not on file  . Transportation needs:    Medical: Not on file    Non-medical: Not on file  Tobacco Use  . Smoking status: Never Smoker  . Smokeless tobacco: Former Neurosurgeon    Types: Snuff  Substance and Sexual Activity  . Alcohol use: Yes    Alcohol/week: 1.0 standard drinks    Types: 1 Glasses of wine per week    Comment: occasionally  . Drug use: No  . Sexual activity: Yes  Lifestyle  . Physical activity:    Days per week: Not on file    Minutes per session: Not on file  . Stress: Not on file  Relationships  . Social connections:    Talks on phone: Not on file    Gets  together: Not on file    Attends religious service: Not on file    Active member of club or organization: Not on file    Attends meetings of clubs or organizations: Not on file    Relationship status: Not on file  . Intimate partner violence:    Fear of current or ex partner: Not on file    Emotionally abused: Not on file    Physically abused: Not on file    Forced sexual activity: Not on file  Other Topics Concern  . Not on file  Social History Narrative   Entered 05/2014:       Married.    3 children--55 month old,  24 y/o,  5 y/o      In Electronics engineer      Currently working as Musician.    Says was with [employer]. Just given this assignment 1 month ago.            Review of Systems  All other systems reviewed and are negative.      Objective:   Physical Exam  Constitutional: He appears well-developed and well-nourished. No distress.  HENT:  Right Ear:  External ear normal.  Left Ear: External ear normal.  Nose: Mucosal edema and rhinorrhea present. Right sinus exhibits no maxillary sinus tenderness and no frontal sinus tenderness. Left sinus exhibits no maxillary sinus tenderness and no frontal sinus tenderness.  Mouth/Throat: Oropharynx is clear and moist. No oropharyngeal exudate.  Eyes: Conjunctivae are normal.  Neck: Neck supple.  Cardiovascular: Normal rate, regular rhythm and normal heart sounds.  Pulmonary/Chest: Effort normal and breath sounds normal. No respiratory distress. He has no wheezes. He has no rales.  Abdominal: Soft. Bowel sounds are normal.  Lymphadenopathy:    He has no cervical adenopathy.  Skin: He is not diaphoretic.  Vitals reviewed.         Assessment & Plan:  Viral gastroenteritis - Plan: promethazine (PHENERGAN) 25 MG tablet  Patient appears to have viral gastroenteritis.  Recommended Phenergan 25 mg every 8 hours as needed for nausea and vomiting.  Push fluids.  Eat a bland, brat diet.  Advance diet slowly as tolerated.   Discussed methods of transfer of this virus and ways to prevent spread to his family members.  Focus on maintaining hydration.  Can certainly use ibuprofen for headache and fever.  Recheck in 48 hours if no better or sooner if worse.

## 2017-11-24 IMAGING — CR DG CHEST 2V
2 series · 2 of 2 positions shown · non-contrast
Comparison: 11/29/2014

CLINICAL DATA: Right lower chest pain for 2 days.

EXAM:
CHEST  2 VIEW

[chest pa]
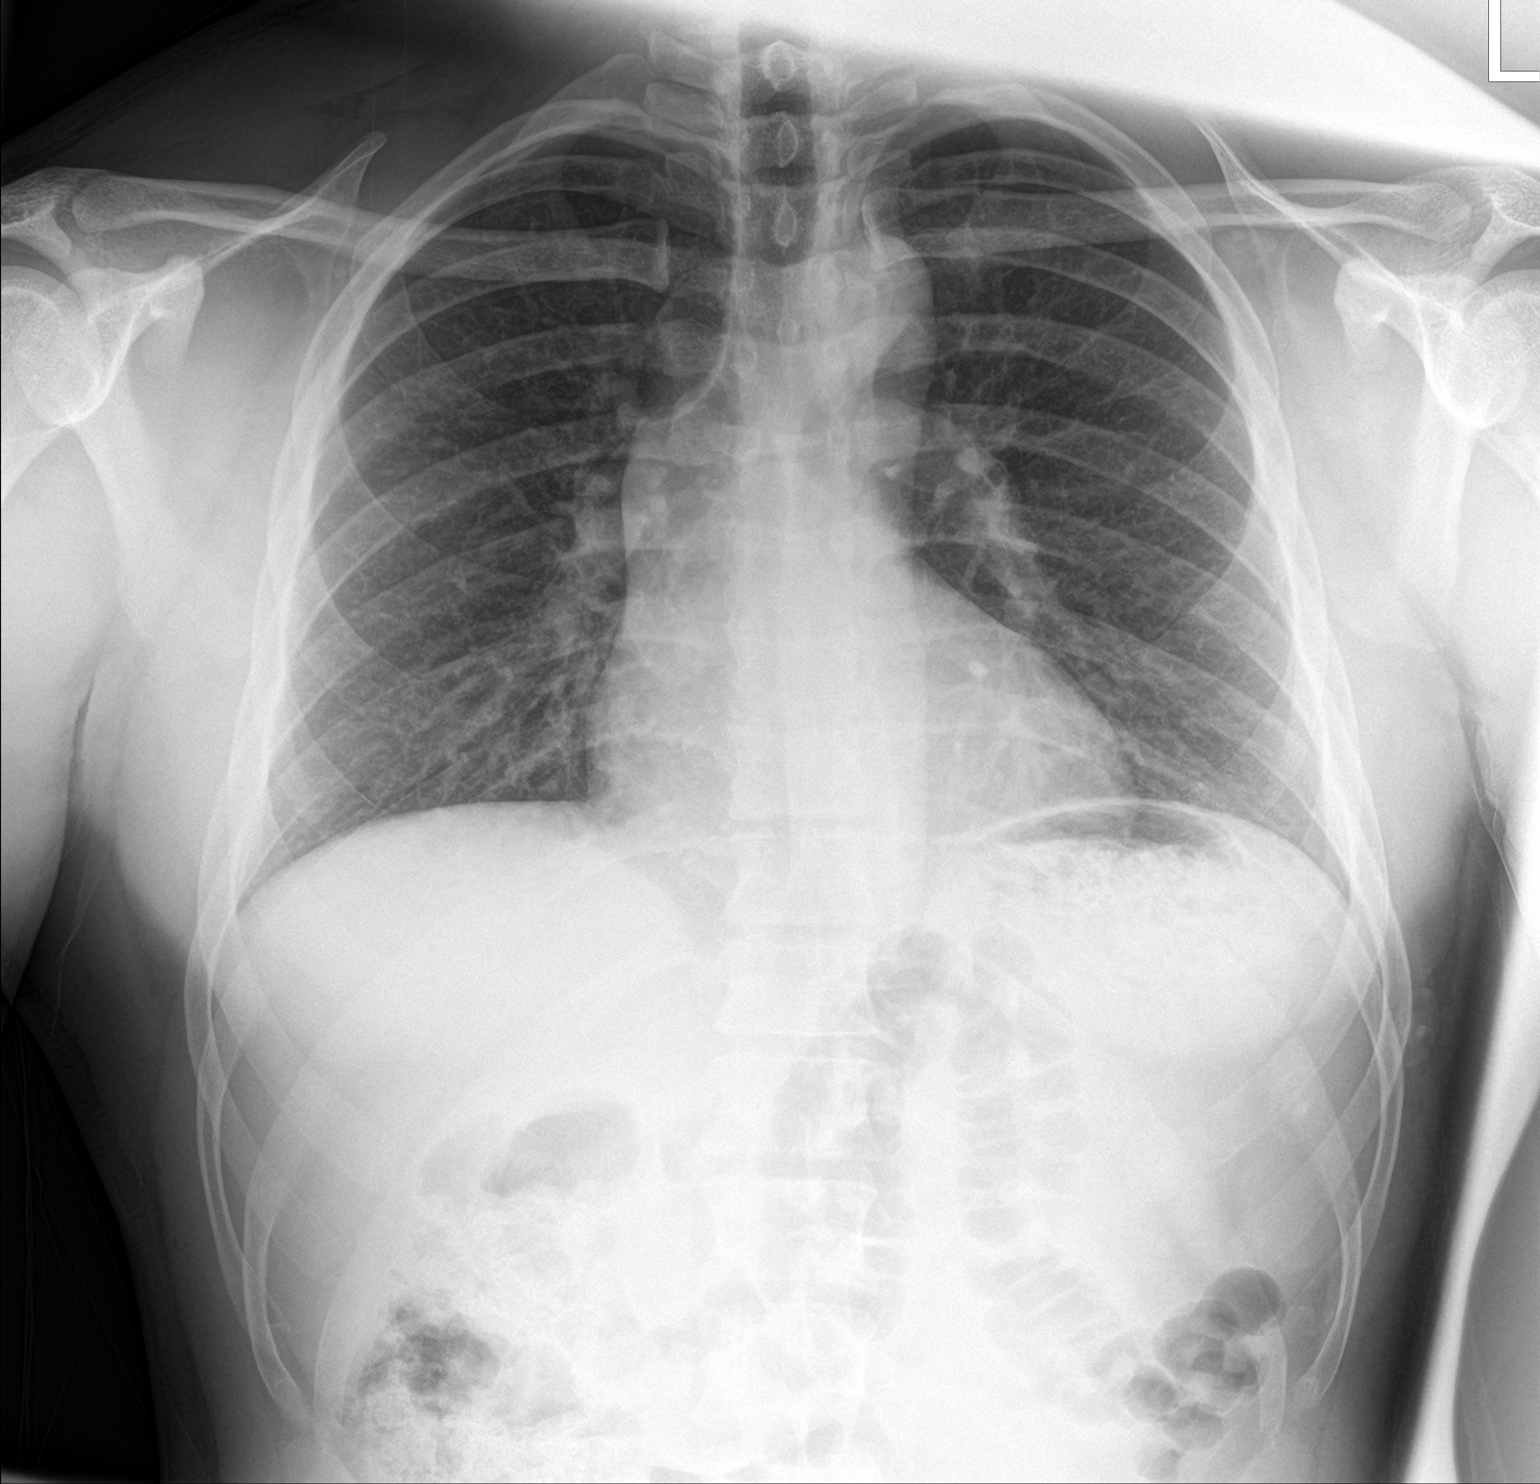

[chest lat]
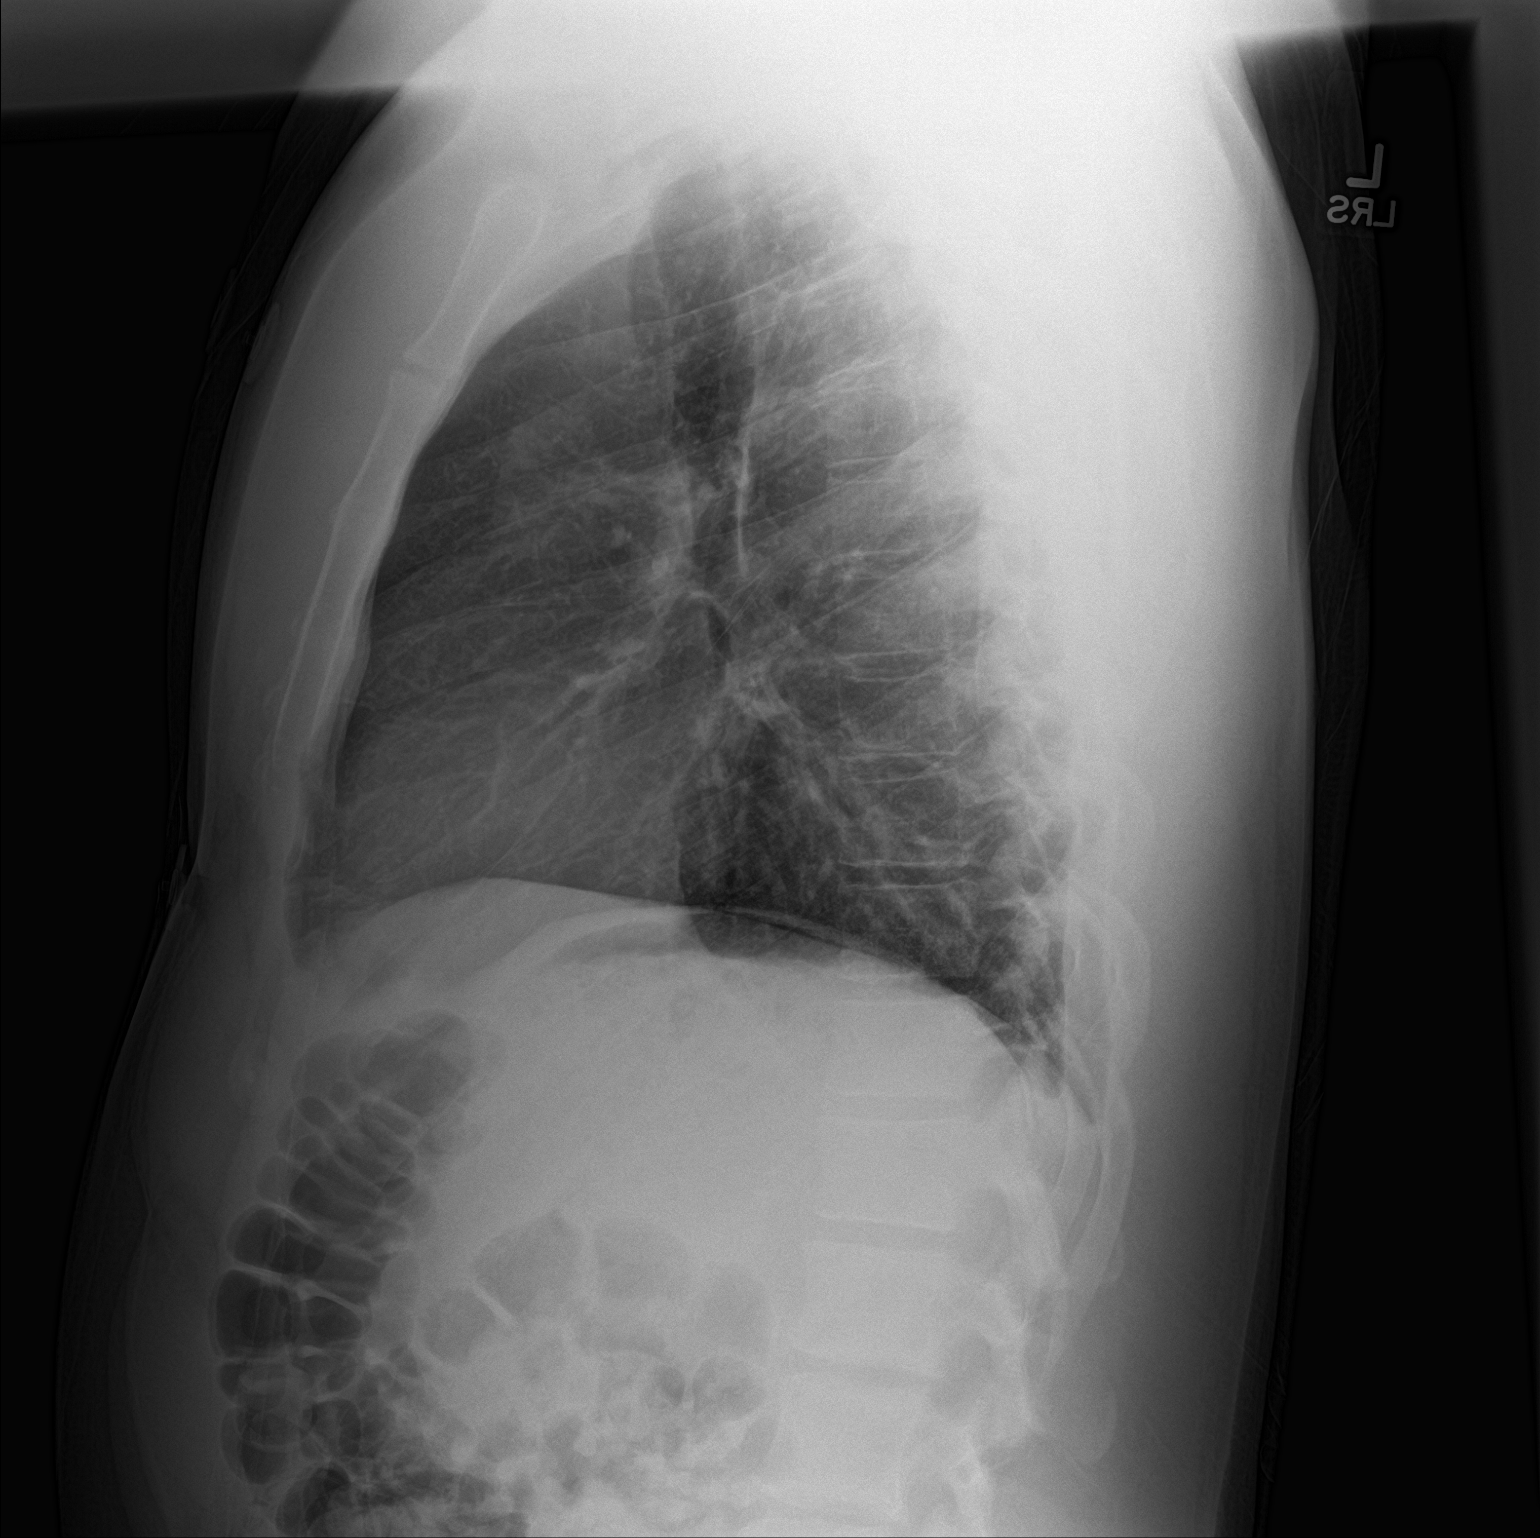

[2 of 2 positions shown; findings below may reference images not displayed]

FINDINGS: The cardiomediastinal contours are normal. The lungs are clear.
Pulmonary vasculature is normal. No consolidation, pleural effusion,
or pneumothorax. No acute osseous abnormalities are seen.
IMPRESSION: No active cardiopulmonary disease.

## 2018-01-18 ENCOUNTER — Ambulatory Visit: Admitting: Family Medicine

## 2018-01-19 ENCOUNTER — Encounter: Payer: Self-pay | Admitting: Family Medicine

## 2018-01-19 ENCOUNTER — Ambulatory Visit
Admission: RE | Admit: 2018-01-19 | Discharge: 2018-01-19 | Disposition: A | Discharge status: Home / Self Care | Source: Ambulatory Visit | Attending: Family Medicine | Admitting: Family Medicine

## 2018-01-19 ENCOUNTER — Ambulatory Visit (INDEPENDENT_AMBULATORY_CARE_PROVIDER_SITE_OTHER): Admitting: Family Medicine

## 2018-01-19 VITALS — BP 110/82 | HR 70 | Temp 98.1°F | Resp 16 | Wt 215.5 lb

## 2018-01-19 DIAGNOSIS — M545 Low back pain, unspecified: Secondary | ICD-10-CM

## 2018-01-19 NOTE — Progress Notes (Signed)
Patient ID: Richard Patrick, male    DOB: 1985-10-18, 32 y.o.   MRN: [id number]  PCP: Salley Scarleturham, Kawanta F, MD  Chief Complaint  Patient presents with  . Back Pain    Has c/o lower back pain. States pain has been going on for a while but appear to be worsening    Subjective:   Richard JoJulius Piacentini is a 32 y.o. male, presents to clinic with CC of back pain. Back Pain  This is a new (had an old back injury to L1-L3) problem. The current episode started 1 to 4 weeks ago (2 months). The problem occurs constantly. The problem has been gradually worsening since onset. The pain is present in the lumbar spine. The quality of the pain is described as aching. The pain is at a severity of 3/10. The pain is mild. The symptoms are aggravated by bending. Pertinent negatives include no abdominal pain, bladder incontinence, bowel incontinence, chest pain, dysuria, fever, headaches, leg pain, numbness, paresis, paresthesias, pelvic pain, perianal numbness, tingling, weakness or weight loss. He has tried NSAIDs for the symptoms.     Patient denies saddle anesthesia incontinence of urine or stool, lower extremity weakness, has no history of IV drug use or chronic steroid use, no personal cancer history.  Pain is mild, aggravated by bending over or sitting for long periods of time.  Radiation of pain to his buttocks or down his legs, pain has been managed by taking 1 ibuprofen a day  Patient Active Problem List   Diagnosis Date Noted  . Acne vulgaris 09/19/2014  . Vitamin D deficiency 06/05/2014  . Allergic rhinitis 05/28/2014     Prior to Admission medications   Not on File     No Known Allergies   Family History  Problem Relation Age of Onset  . Thyroid disease Sister   . Diabetes Paternal Uncle         Review of Systems  Constitutional: Negative.  Negative for fever and weight loss.  HENT: Negative.   Eyes: Negative.   Respiratory: Negative.   Cardiovascular: Negative.  Negative for chest  pain.  Gastrointestinal: Negative.  Negative for abdominal pain, bowel incontinence, constipation, diarrhea, nausea and vomiting.  Endocrine: Negative.   Genitourinary: Negative.  Negative for bladder incontinence, decreased urine volume, difficulty urinating, dysuria, enuresis, flank pain, frequency, hematuria, pelvic pain and urgency.  Musculoskeletal: Positive for back pain. Negative for gait problem, joint swelling and myalgias.  Skin: Negative.   Allergic/Immunologic: Negative.   Neurological: Negative.  Negative for tingling, weakness, numbness, headaches and paresthesias.  Hematological: Negative.   Psychiatric/Behavioral: Negative.   All other systems reviewed and are negative.      Objective:    Vitals:   01/19/18 1556  BP: 110/82  Pulse: 70  Resp: 16  Temp: 98.1 F (36.7 C)  TempSrc: Oral  SpO2: 99%  Weight: 215 lb 8 oz (97.8 kg)      Physical Exam  Constitutional: He appears well-developed.  HENT:  Head: Normocephalic and atraumatic.  Nose: Nose normal.  Eyes: Conjunctivae are normal. Right eye exhibits no discharge. Left eye exhibits no discharge.  Neck: No tracheal deviation present.  Cardiovascular: Normal rate and regular rhythm.  Pulmonary/Chest: Effort normal. No stridor. No respiratory distress.  Musculoskeletal:       Cervical back: Normal.       Thoracic back: Normal.       Lumbar back: Normal.  No tenderness or step-off from cervical to lumbar spine, no paraspinal muscle  tenderness from cervical to lumbar spine, negative straight leg raise bilaterally   Neurological: He is alert. He exhibits normal muscle tone. Coordination normal.  Normal sensation to light touch to bilateral LE Normal gait 5/5 strength b/l dorsiflexion and plantarflexion, flexion and extension at b/l knees  Skin: Skin is warm and dry. No rash noted.  Psychiatric: He has a normal mood and affect. His behavior is normal.  Nursing note and vitals reviewed.           Assessment & Plan:      ICD-10-CM   1. Acute low back pain without sciatica, unspecified back pain laterality M54.5 DG Lumbar Spine Complete    Patient with acute midline low back pain to upper lumbar spine, no tenderness, deformity, step-off or muscle tenderness with palpation to spine, normal ROM, no red flags, no neurological deficits.  Pain has become slightly worse over the past several months and there is some remote past injury which she states he fell on his back fracturing L1-L3 and he did not follow-up.  Pain is mostly exacerbated by bending over but he remains very active, he has been managing his pain with 1 ibuprofen a day, he even ran a half marathon last month without any difficulty.  He refuses any medical treatment at this time including offer of a steroid burst trial to treat possible inflammation secondary to arthritic changes in his back, and he refuses muscle relaxers which I offered for only muscle tightness or spasms which he currently does not seem to have.  Will obtain a lumbar spinal plain film.  Feel the patient would want a referral to a back specialist to evaluate.   Danelle Berry, PA-C 01/19/18 4:02 PM

## 2018-01-19 NOTE — Patient Instructions (Signed)
Lets get imaging of back and see what is there  Can do ibuprofen 200-600 mg 2-3x a day  I would like to try a steroid burst to see if inflammation would improve - but we'll wait for that for now  Low Back Sprain Rehab Ask your health care provider which exercises are safe for you. Do exercises exactly as told by your health care provider and adjust them as directed. It is normal to feel mild stretching, pulling, tightness, or discomfort as you do these exercises, but you should stop right away if you feel sudden pain or your pain gets worse. Do not begin these exercises until told by your health care provider. Stretching and range of motion exercises These exercises warm up your muscles and joints and improve the movement and flexibility of your back. These exercises also help to relieve pain, numbness, and tingling. Exercise A: Lumbar rotation  1. Lie on your back on a firm surface and bend your knees. 2. Straighten your arms out to your sides so each arm forms an "L" shape with a side of your body (a 90 degree angle). 3. Slowly move both of your knees to one side of your body until you feel a stretch in your lower back. Try not to let your shoulders move off of the floor. 4. Hold for __________ seconds. 5. Tense your abdominal muscles and slowly move your knees back to the starting position. 6. Repeat this exercise on the other side of your body. Repeat __________ times. Complete this exercise __________ times a day. Exercise B: Prone extension on elbows  1. Lie on your abdomen on a firm surface. 2. Prop yourself up on your elbows. 3. Use your arms to help lift your chest up until you feel a gentle stretch in your abdomen and your lower back. ? This will place some of your body weight on your elbows. If this is uncomfortable, try stacking pillows under your chest. ? Your hips should stay down, against the surface that you are lying on. Keep your hip and back muscles relaxed. 4. Hold for  __________ seconds. 5. Slowly relax your upper body and return to the starting position. Repeat __________ times. Complete this exercise __________ times a day. Strengthening exercises These exercises build strength and endurance in your back. Endurance is the ability to use your muscles for a long time, even after they get tired. Exercise C: Pelvic tilt 1. Lie on your back on a firm surface. Bend your knees and keep your feet flat. 2. Tense your abdominal muscles. Tip your pelvis up toward the ceiling and flatten your lower back into the floor. ? To help with this exercise, you may place a small towel under your lower back and try to push your back into the towel. 3. Hold for __________ seconds. 4. Let your muscles relax completely before you repeat this exercise. Repeat __________ times. Complete this exercise __________ times a day. Exercise D: Alternating arm and leg raises  1. Get on your hands and knees on a firm surface. If you are on a hard floor, you may want to use padding to cushion your knees, such as an exercise mat. 2. Line up your arms and legs. Your hands should be below your shoulders, and your knees should be below your hips. 3. Lift your left leg behind you. At the same time, raise your right arm and straighten it in front of you. ? Do not lift your leg higher than your hip. ?  Do not lift your arm higher than your shoulder. ? Keep your abdominal and back muscles tight. ? Keep your hips facing the ground. ? Do not arch your back. ? Keep your balance carefully, and do not hold your breath. 4. Hold for __________ seconds. 5. Slowly return to the starting position and repeat with your right leg and your left arm. Repeat __________ times. Complete this exercise __________ times a day. Exercise E: Abdominal set with straight leg raise  1. Lie on your back on a firm surface. 2. Bend one of your knees and keep your other leg straight. 3. Tense your abdominal muscles and lift  your straight leg up, 4-6 inches (10-15 cm) off the ground. 4. Keep your abdominal muscles tight and hold for __________ seconds. ? Do not hold your breath. ? Do not arch your back. Keep it flat against the ground. 5. Keep your abdominal muscles tense as you slowly lower your leg back to the starting position. 6. Repeat with your other leg. Repeat __________ times. Complete this exercise __________ times a day. Posture and body mechanics  Body mechanics refers to the movements and positions of your body while you do your daily activities. Posture is part of body mechanics. Good posture and healthy body mechanics can help to relieve stress in your body's tissues and joints. Good posture means that your spine is in its natural S-curve position (your spine is neutral), your shoulders are pulled back slightly, and your head is not tipped forward. The following are general guidelines for applying improved posture and body mechanics to your everyday activities. Standing   When standing, keep your spine neutral and your feet about hip-width apart. Keep a slight bend in your knees. Your ears, shoulders, and hips should line up.  When you do a task in which you stand in one place for a long time, place one foot up on a stable object that is 2-4 inches (5-10 cm) high, such as a footstool. This helps keep your spine neutral. Sitting   When sitting, keep your spine neutral and keep your feet flat on the floor. Use a footrest, if necessary, and keep your thighs parallel to the floor. Avoid rounding your shoulders, and avoid tilting your head forward.  When working at a desk or a computer, keep your desk at a height where your hands are slightly lower than your elbows. Slide your chair under your desk so you are close enough to maintain good posture.  When working at a computer, place your monitor at a height where you are looking straight ahead and you do not have to tilt your head forward or downward to  look at the screen. Resting   When lying down and resting, avoid positions that are most painful for you.  If you have pain with activities such as sitting, bending, stooping, or squatting (flexion-based activities), lie in a position in which your body does not bend very much. For example, avoid curling up on your side with your arms and knees near your chest (fetal position).  If you have pain with activities such as standing for a long time or reaching with your arms (extension-based activities), lie with your spine in a neutral position and bend your knees slightly. Try the following positions:  Lying on your side with a pillow between your knees.  Lying on your back with a pillow under your knees. Lifting   When lifting objects, keep your feet at least shoulder-width apart and tighten your  abdominal muscles.  Bend your knees and hips and keep your spine neutral. It is important to lift using the strength of your legs, not your back. Do not lock your knees straight out.  Always ask for help to lift heavy or awkward objects. This information is not intended to replace advice given to you by your health care provider. Make sure you discuss any questions you have with your health care provider. Document Released: 02/02/2005 Document Revised: 10/10/2015 Document Reviewed: 11/14/2014 Elsevier Interactive Patient Education  [employer]2018 [employer]

## 2018-01-20 NOTE — Addendum Note (Signed)
Addended by: Danelle BerryAPIA, Brandis Matsuura on: 01/20/2018 06:55 PM   Modules accepted: Orders

## 2018-01-21 NOTE — Progress Notes (Signed)
I put in already, thank you

## 2018-02-16 HISTORY — PX: ROTATOR CUFF REPAIR: SHX139

## 2018-03-23 ENCOUNTER — Ambulatory Visit: Admitting: Family Medicine

## 2018-03-25 ENCOUNTER — Ambulatory Visit: Admitting: Family Medicine

## 2018-04-12 ENCOUNTER — Telehealth: Payer: Self-pay | Admitting: *Deleted

## 2018-04-12 NOTE — Telephone Encounter (Signed)
Received call from patient.   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   Report that therapist recommended medication, but cannot prescribe it.   Advised to bring by note for MD to review.

## 2018-04-13 MED ORDER — BUSPIRONE HCL 10 MG PO TABS: ORAL_TABLET | ORAL | 2 refills | Status: DC

## 2018-04-13 MED ORDER — CLONIDINE HCL 0.1 MG PO TABS: 0.1000 mg | ORAL_TABLET | Freq: Every day | ORAL | 3 refills | Status: DC

## 2018-04-13 NOTE — Telephone Encounter (Signed)
Left voicemail for patient, want to discuss potential side effects And use of the medications They are trying to taper up fairly quickly with the buspar, unknown at this time severity of his anxiety

## 2018-04-13 NOTE — Telephone Encounter (Signed)
Received documentation from therapist for patient. Patient spouse brought in a picture of card.  Noted medication recommendations are as follows: Clonidine 0.1mg  PO Q HS Buspar 5mg  PO BID x7 days, then 10mg  PO BID x7 days, then 15mg  PO BID.   Request for chart notes sent to therapy office.

## 2018-04-13 NOTE — Telephone Encounter (Signed)
I spoke with pt he is being treated for anxiety as well as insomnia with vivid dreams which he may act out if somebody tries to wake him up.  Hence the clonidine.  Discussed the side effects of the clonidine including sedation and possible decrease in blood pressure which may cause dizzy spells fatigue he can keep an eye out for the symptoms.  He does admit he has had significant anxiety but he is can be seen in the therapist weekly as he starts the medication.  I will send over BuSpar 10 mg he will take a half a tablet twice a day for 1 week and then bump up to 1 tablet twice a day.  He will have visit with his therapist in between there.  If he has any side effects of the medication including increase in anxiety or depressed symptoms GI intolerance headaches he will let us know.

## 2018-04-20 ENCOUNTER — Encounter: Payer: Self-pay | Admitting: Family Medicine

## 2018-05-10 ENCOUNTER — Ambulatory Visit

## 2018-06-06 ENCOUNTER — Telehealth: Payer: Self-pay | Admitting: Family Medicine

## 2018-06-06 NOTE — Telephone Encounter (Signed)
pts wife called and had ?'s about a prescription did not state which one on vm

## 2018-06-07 MED ORDER — BUSPIRONE HCL 15 MG PO TABS: 15.0000 mg | ORAL_TABLET | Freq: Two times a day (BID) | ORAL | 3 refills | Status: DC

## 2018-06-07 NOTE — Telephone Encounter (Signed)
Call placed to patient to inquire.   Reports that he has been taking Buspar with no SE.   Requested to change prescription from Buspar 10mg  tabs take 1 1/2 tabs PO BID to Buspar 15mg  tabs PO BID.   Prescription sent to pharmacy.

## 2018-06-13 ENCOUNTER — Encounter: Payer: Self-pay | Admitting: Family Medicine

## 2018-06-13 ENCOUNTER — Other Ambulatory Visit: Payer: Self-pay

## 2018-06-13 ENCOUNTER — Ambulatory Visit (INDEPENDENT_AMBULATORY_CARE_PROVIDER_SITE_OTHER): Admitting: Family Medicine

## 2018-06-13 VITALS — BP 112/64 | HR 82 | Temp 98.7°F | Resp 14 | Ht 69.0 in | Wt 214.0 lb

## 2018-06-13 DIAGNOSIS — F411 Generalized anxiety disorder: Secondary | ICD-10-CM | POA: Diagnosis not present

## 2018-06-13 DIAGNOSIS — M75102 Unspecified rotator cuff tear or rupture of left shoulder, not specified as traumatic: Secondary | ICD-10-CM

## 2018-06-13 DIAGNOSIS — F5104 Psychophysiologic insomnia: Secondary | ICD-10-CM | POA: Diagnosis not present

## 2018-06-13 NOTE — Assessment & Plan Note (Signed)
Continue buspar

## 2018-06-13 NOTE — Patient Instructions (Signed)
Referral to [facility] Continue anti-inflammatory as needed

## 2018-06-13 NOTE — Progress Notes (Signed)
   Subjective:    Patient ID: Richard Patrick, male    DOB: 1985/10/27, 33 y.o.   MRN: [id number]  Patient presents for L Shoulder Pain (x months)    Clonidine does not help as much for sleep, if he wakes up cant get back in general, he is trying to practice good sleep hygiene.  He did recently try melatonin that has been helping some.  Buspar is working well in general for his anxiety.  Has had left shoulder pain from greater than 3 months.  He is not remember any particular injury but has been in the [employer] for the past 15 years.  He has not been lifting weights in the past couple months because of the pain.  If he rotate his arm particular weights he tends to get pain on the top of the shoulder.  He does feel some weakness in it.  When he is playing with the kids he also notices pain and weakness in the shoulder.  Has been taking ibuprofen which helps some but he is not wanting to be on medication daily.      Review Of Systems:  GEN- denies fatigue, fever, weight loss,weakness, recent illness HEENT- denies eye drainage, change in vision, nasal discharge, CVS- denies chest pain, palpitations RESP- denies SOB, cough, wheeze ABD- denies N/V, change in stools, abd pain GU- denies dysuria, hematuria, dribbling, incontinence MSK- + joint pain, muscle aches, injury Neuro- denies headache, dizziness, syncope, seizure activity       Objective:    BP 112/64   Pulse 82   Temp 98.7 F (37.1 C) (Oral)   Resp 14   Ht 5\' 9"  (1.753 m)   Wt 214 lb (97.1 kg)   SpO2 98%   BMI 31.60 kg/m  GEN- NAD, alert and oriented x3 Neck- Supple, full range of motion MSK- normal inspection, biceps in tact, equivocal empty can left side, normal right side, , neg neers sign bilat, triceps in tact, good ROM, pain with rotation of left shoulder/arm  Neuro- normal sensation in UE, normal tone UE, muscular build, strength 4+/5 LUE compared to right  Pulses- Radial 2+ Psych- normal affect and mood          Assessment & Plan:      Problem List Items Addressed This Visit      Unprioritized   Chronic insomnia    Can try the melatonin with the clonidine Will f/u as scheduled with psychiatry      GAD (generalized anxiety disorder)    Continue buspar       Other Visit Diagnoses    Rotator cuff syndrome of left shoulder    -  Primary   Referral to orthopedics for evaluation, 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 continue ibuprofen as needed      Note: This dictation was prepared with Dragon dictation along with smaller phrase technology. Any transcriptional errors that result from this process are unintentional.

## 2018-06-13 NOTE — Assessment & Plan Note (Signed)
Can try the melatonin with the clonidine Will f/u as scheduled with psychiatry

## 2018-06-21 ENCOUNTER — Telehealth: Payer: Self-pay | Admitting: *Deleted

## 2018-06-21 DIAGNOSIS — M75102 Unspecified rotator cuff tear or rupture of left shoulder, not specified as traumatic: Secondary | ICD-10-CM

## 2018-06-21 NOTE — Telephone Encounter (Signed)
Received call from patient.   Reports that he has x1 day L eye swelling with redness and irritation, sensitivity to light, watering and yellow crusts. MD aware and NO obtained to use Polytrim 3x daily x7 days. Patient aware and has drops at home.   Also reports that he would like new referral to [facility]. States that he is currently using [facility]. Per MD, ok to change.

## 2018-06-26 ENCOUNTER — Other Ambulatory Visit: Payer: Self-pay | Admitting: Family Medicine

## 2018-06-27 MED ORDER — BUSPIRONE HCL 15 MG PO TABS: 15.0000 mg | ORAL_TABLET | Freq: Two times a day (BID) | ORAL | 3 refills | Status: DC

## 2018-08-04 ENCOUNTER — Telehealth: Payer: Self-pay | Admitting: *Deleted

## 2018-08-04 NOTE — Telephone Encounter (Signed)
Received call from patient wife, Richard Patrick.   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   Advised to self quarantine x14 days as long as asymptomatic. Stay away from other members of household during quarantine.   If Sx develop, contact office for further recommendations.

## 2018-08-05 NOTE — Telephone Encounter (Signed)
noted 

## 2018-08-10 ENCOUNTER — Telehealth: Payer: Self-pay | Admitting: *Deleted

## 2018-08-10 DIAGNOSIS — Z20822 Contact with and (suspected) exposure to covid-19: Secondary | ICD-10-CM

## 2018-08-10 NOTE — Telephone Encounter (Signed)
Will order testing for job

## 2018-08-10 NOTE — Telephone Encounter (Signed)
Pt scheduled for covid testing 08/10/18 @ 8:30 @ [facility]. Instructions given. Orders placed.

## 2018-08-10 NOTE — Telephone Encounter (Signed)
Call placed to patient and patient wife Richard Patrick made aware.

## 2018-08-10 NOTE — Telephone Encounter (Signed)
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

## 2018-08-10 NOTE — Telephone Encounter (Signed)
Received VM from patient wife, [place] [phone] telephone.   Reports that patient job is recommending COVID testing for exposure. Reports that he is 6 days put from exposure in office.   Patient has experienced no Sx.   MD please advise.

## 2018-08-11 ENCOUNTER — Other Ambulatory Visit: Payer: Self-pay

## 2018-08-11 DIAGNOSIS — Z20822 Contact with and (suspected) exposure to covid-19: Secondary | ICD-10-CM

## 2018-08-16 LAB — NOVEL CORONAVIRUS, NAA: SARS-CoV-2, NAA: NOT DETECTED

## 2018-09-09 ENCOUNTER — Other Ambulatory Visit: Payer: Self-pay

## 2018-09-12 ENCOUNTER — Encounter: Payer: Self-pay | Admitting: Family Medicine

## 2018-09-12 ENCOUNTER — Ambulatory Visit (INDEPENDENT_AMBULATORY_CARE_PROVIDER_SITE_OTHER): Admitting: Family Medicine

## 2018-09-12 VITALS — BP 118/60 | HR 74 | Temp 98.2°F | Resp 14 | Ht 69.0 in | Wt 212.0 lb

## 2018-09-12 DIAGNOSIS — Z0000 Encounter for general adult medical examination without abnormal findings: Secondary | ICD-10-CM

## 2018-09-12 DIAGNOSIS — Z0001 Encounter for general adult medical examination with abnormal findings: Secondary | ICD-10-CM | POA: Diagnosis not present

## 2018-09-12 DIAGNOSIS — F411 Generalized anxiety disorder: Secondary | ICD-10-CM | POA: Diagnosis not present

## 2018-09-12 NOTE — Patient Instructions (Signed)
I recommend eye visit once a year I recommend dental visit every 6 months Goal is to  Exercise 30 minutes 5 days a week We will send a letter with lab results  F/U 1 year for physical  

## 2018-09-12 NOTE — Assessment & Plan Note (Signed)
F/U Therapist Continue buspar at current dose

## 2018-09-12 NOTE — Progress Notes (Signed)
   Subjective:    Patient ID: Richard Patrick, male    DOB: 1986-02-13, 33 y.o.   MRN: [id number]  Patient presents for Annual Exam (is not fasting)   Pt here for CPE    [facility]- Dr. Theda Sers had labral tear and rotator cuff surgery    Taking norco and robaxin    - felt like he couldn't breathe with the oxycodone    Chronic insomnia/ GAD-  Recently restarted buspar was off when he had his surgery was worried about mixing meds , has therapy appt on 28th at  10am   no concerns with the meds     No new concens     Immunizations- UTD      exercises regularly, works as Psychologist, counselling  Review Of Systems:  GEN- denies fatigue, fever, weight loss,weakness, recent illness HEENT- denies eye drainage, change in vision, nasal discharge, CVS- denies chest pain, palpitations RESP- denies SOB, cough, wheeze ABD- denies N/V, change in stools, abd pain GU- denies dysuria, hematuria, dribbling, incontinence MSK- denies joint pain, muscle aches, injury Neuro- denies headache, dizziness, syncope, seizure activity       Objective:    BP 118/60   Pulse 74   Temp 98.2 F (36.8 C) (Oral)   Resp 14   Ht 5\' 9"  (1.753 m)   Wt 212 lb (96.2 kg)   SpO2 97%   BMI 31.31 kg/m  GEN- NAD, alert and oriented x3 HEENT- PERRL, EOMI, non injected sclera, pink conjunctiva, MMM, oropharynx clear Neck- Supple, no thyromegaly CVS- RRR, no murmur RESP-CTAB ABD-NABS,soft,NT,ND EXT- No edema, left arm in sling Psych- normal affect and mood Pulses- Radial, DP- 2+   Audit C/FALL/Depression screen neg     Assessment & Plan:      Problem List Items Addressed This Visit      Unprioritized   GAD (generalized anxiety disorder)    F/U Therapist Continue buspar at current dose       Other Visit Diagnoses    Routine general medical examination at a health care facility    -  Primary   CPE done, fasting labs, immunizations UTD, continue buspar, f/u therapist tomorrow   Relevant Orders   CBC  with Differential/Platelet   Comprehensive metabolic panel   Lipid panel      Note: This dictation was prepared with Dragon dictation along with smaller phrase technology. Any transcriptional errors that result from this process are unintentional.

## 2018-09-13 LAB — COMPREHENSIVE METABOLIC PANEL
AG Ratio: 1.7 (calc) (ref 1.0–2.5)
ALT: 20 U/L (ref 9–46)
AST: 36 U/L (ref 10–40)
Albumin: 4.8 g/dL (ref 3.6–5.1)
Alkaline phosphatase (APISO): 65 U/L (ref 36–130)
BUN: 14 mg/dL (ref 7–25)
CO2: 25 mmol/L (ref 20–32)
Calcium: 9.7 mg/dL (ref 8.6–10.3)
Chloride: 103 mmol/L (ref 98–110)
Creat: 1.02 mg/dL (ref 0.60–1.35)
Globulin: 2.8 g/dL (calc) (ref 1.9–3.7)
Glucose, Bld: 90 mg/dL (ref 65–99)
Potassium: 4.4 mmol/L (ref 3.5–5.3)
Sodium: 139 mmol/L (ref 135–146)
Total Bilirubin: 0.5 mg/dL (ref 0.2–1.2)
Total Protein: 7.6 g/dL (ref 6.1–8.1)

## 2018-09-13 LAB — CBC WITH DIFFERENTIAL/PLATELET
Absolute Monocytes: 510 cells/uL (ref 200–950)
Basophils Absolute: 50 cells/uL (ref 0–200)
Basophils Relative: 0.9 %
Eosinophils Absolute: 129 cells/uL (ref 15–500)
Eosinophils Relative: 2.3 %
HCT: 44.7 % (ref 38.5–50.0)
Hemoglobin: 15.7 g/dL (ref 13.2–17.1)
Lymphs Abs: 2178 cells/uL (ref 850–3900)
MCH: 31.3 pg (ref 27.0–33.0)
MCHC: 35.1 g/dL (ref 32.0–36.0)
MCV: 89 fL (ref 80.0–100.0)
MPV: 11.9 fL (ref 7.5–12.5)
Monocytes Relative: 9.1 %
Neutro Abs: 2733 cells/uL (ref 1500–7800)
Neutrophils Relative %: 48.8 %
Platelets: 212 10*3/uL (ref 140–400)
RBC: 5.02 10*6/uL (ref 4.20–5.80)
RDW: 12.9 % (ref 11.0–15.0)
Total Lymphocyte: 38.9 %
WBC: 5.6 10*3/uL (ref 3.8–10.8)

## 2018-09-13 LAB — LIPID PANEL
Cholesterol: 166 mg/dL (ref <200)
HDL: 51 mg/dL (ref >=40)
LDL Cholesterol (Calc): 96 mg/dL (calc)
Non-HDL Cholesterol (Calc): 115 mg/dL (calc) (ref <130)
Total CHOL/HDL Ratio: 3.3 (calc) (ref <5.0)
Triglycerides: 93 mg/dL (ref <150)

## 2018-09-14 ENCOUNTER — Encounter: Payer: Self-pay | Admitting: *Deleted

## 2019-01-10 ENCOUNTER — Other Ambulatory Visit: Payer: Self-pay | Admitting: *Deleted

## 2019-01-10 MED ORDER — MINOCYCLINE HCL 50 MG PO CAPS: 50.0000 mg | ORAL_CAPSULE | Freq: Two times a day (BID) | ORAL | 6 refills | Status: DC

## 2019-01-10 NOTE — Telephone Encounter (Signed)
Received call from patient.   Requested refill on Minocycline for Acne.   Ok to refill?

## 2019-02-07 ENCOUNTER — Ambulatory Visit (INDEPENDENT_AMBULATORY_CARE_PROVIDER_SITE_OTHER): Admitting: Family Medicine

## 2019-02-07 ENCOUNTER — Other Ambulatory Visit: Payer: Self-pay

## 2019-02-07 ENCOUNTER — Encounter: Payer: Self-pay | Admitting: Family Medicine

## 2019-02-07 VITALS — BP 100/68 | HR 70 | Temp 97.2°F | Resp 12 | Ht 69.0 in | Wt 227.0 lb

## 2019-02-07 DIAGNOSIS — G5761 Lesion of plantar nerve, right lower limb: Secondary | ICD-10-CM

## 2019-02-07 NOTE — Progress Notes (Signed)
Subjective:    Patient ID: Richard Patrick, male    DOB: 21-Dec-1985, 33 y.o.   MRN: [id number]  HPI  Sunday, the patient developed pain in the webspace between his first and second toes on his right foot.  He is very tender to palpation in the webspace between the toes however the first and second metatarsals are nontender.  The proximal phalanxes on each toe are nontender.  He is able to move and wiggle his toes without any pain.  He has normal pulses at the dorsalis pedis and posterior tibialis in the right foot.  There is no bruising or swelling or palpable abnormality.  The pain sounds neuropathic in nature.  Pain is out of proportion to exam when I touch in the webspace but there is no visible deformity.  There is no swelling or inflammation or redness in the MTP joints to suggest gout Past Medical History:  Diagnosis Date  . Allergy    seasonal   Past Surgical History:  Procedure Laterality Date  . ROTATOR CUFF REPAIR Left 2020   Current Outpatient Medications on File Prior to Visit  Medication Sig Dispense Refill  . busPIRone (BUSPAR) 15 MG tablet Take 1 tablet (15 mg total) by mouth 2 (two) times daily. 180 tablet 3  . minocycline (MINOCIN) 50 MG capsule Take 1 capsule (50 mg total) by mouth 2 (two) times daily. 60 capsule 6   No current facility-administered medications on file prior to visit.   No Known Allergies Social History   Socioeconomic History  . Marital status: Married    Spouse name: Not on file  . Number of children: Not on file  . Years of education: Not on file  . Highest education level: Not on file  Occupational History  . Not on file  Tobacco Use  . Smoking status: Never Smoker  . Smokeless tobacco: Former Systems developer    Types: Snuff  Substance and Sexual Activity  . Alcohol use: Yes    Alcohol/week: 1.0 standard drinks    Types: 1 Glasses of wine per week    Comment: occasionally  . Drug use: No  . Sexual activity: Yes  Other Topics Concern  . Not on  file  Social History Narrative   Entered 05/2014:       Married.    25 children--18 month old,  25 y/o,  1 y/o      In [employer] treasurer      Currently working as Recruitment consultant.    Says was with [employer]. Just given this assignment 1 month ago.          Social Determinants of Health   Financial Resource Strain:   . Difficulty of Paying Living Expenses: Not on file  Food Insecurity:   . Worried About Charity fundraiser in the Last Year: Not on file  . Ran Out of Food in the Last Year: Not on file  Transportation Needs:   . Lack of Transportation (Medical): Not on file  . Lack of Transportation (Non-Medical): Not on file  Physical Activity:   . Days of Exercise per Week: Not on file  . Minutes of Exercise per Session: Not on file  Stress:   . Feeling of Stress : Not on file  Social Connections:   . Frequency of Communication with Friends and Family: Not on file  . Frequency of Social Gatherings with Friends and Family: Not on file  . Attends Religious Services: Not on file  . Active  Member of Clubs or Organizations: Not on file  . Attends Banker Meetings: Not on file  . Marital Status: Not on file  Intimate Partner Violence:   . Fear of Current or Ex-Partner: Not on file  . Emotionally Abused: Not on file  . Physically Abused: Not on file  . Sexually Abused: Not on file     Review of Systems  All other systems reviewed and are negative.      Objective:   Physical Exam Vitals reviewed.  Cardiovascular:     Rate and Rhythm: Normal rate and regular rhythm.     Pulses:          Dorsalis pedis pulses are 2+ on the right side.       Posterior tibial pulses are 2+ on the right side.     Heart sounds: Normal heart sounds.  Pulmonary:     Effort: Pulmonary effort is normal.     Breath sounds: Normal breath sounds.  Musculoskeletal:     Right foot: Normal range of motion. No deformity, bunion or Charcot foot.       Feet:  Feet:     Right foot:     Skin  integrity: Skin integrity normal. No ulcer, blister or skin breakdown.           Assessment & Plan:  Morton neuroma, right - Plan: DG Foot Complete Right  I believe the patient may have either a sprain or possibly a Morton's neuroma.  I have recommended that the patient use ibuprofen 800 mg every 8 hours and get an x-ray of his foot.  I anticipate that the pain will gradually improve over the next week.  If pain worsens he is to recheck or if there is any abnormality seen on the x-ray he is to recheck.

## 2019-02-22 ENCOUNTER — Other Ambulatory Visit: Payer: Self-pay

## 2019-02-22 ENCOUNTER — Ambulatory Visit (INDEPENDENT_AMBULATORY_CARE_PROVIDER_SITE_OTHER): Admitting: Family Medicine

## 2019-02-22 ENCOUNTER — Encounter: Payer: Self-pay | Admitting: Family Medicine

## 2019-02-22 VITALS — BP 100/62 | HR 80 | Temp 98.7°F | Resp 14 | Ht 69.0 in | Wt 222.0 lb

## 2019-02-22 DIAGNOSIS — S0086XA Insect bite (nonvenomous) of other part of head, initial encounter: Secondary | ICD-10-CM | POA: Diagnosis not present

## 2019-02-22 DIAGNOSIS — L089 Local infection of the skin and subcutaneous tissue, unspecified: Secondary | ICD-10-CM | POA: Diagnosis not present

## 2019-02-22 DIAGNOSIS — W57XXXA Bitten or stung by nonvenomous insect and other nonvenomous arthropods, initial encounter: Secondary | ICD-10-CM

## 2019-02-22 MED ORDER — MUPIROCIN CALCIUM 2 % EX CREA: 1.0000 "application " | TOPICAL_CREAM | Freq: Two times a day (BID) | CUTANEOUS | 0 refills | Status: DC

## 2019-02-22 MED ORDER — SULFAMETHOXAZOLE-TRIMETHOPRIM 800-160 MG PO TABS: 1.0000 | ORAL_TABLET | Freq: Two times a day (BID) | ORAL | 0 refills | Status: DC

## 2019-02-22 NOTE — Patient Instructions (Signed)
F/U as needed Use cream twice a day  Warm compresses Take bactrim for 1 week

## 2019-02-22 NOTE — Progress Notes (Signed)
   Subjective:    Patient ID: Richard Patrick, male    DOB: 09-29-1985, 34 y.o.   MRN: [id number]  Patient presents for Insect Bite (x3 days- bite to L upper cheek- clear drainage that crusts to yellow- itching- swollen lymph nodes on L side of neck)  Patient here with insect bite to his left upper cheek he noticed it on Sunday.  He did take Benadryl there was no improvement.  He has had some crusting and some yellow drainage from it.  He also has a swollen lymph node beneath the left mandible.  He denies any ear pain sinus pressure sore throat fever headache no other rash on the body.  No cough or congestion.  He thinks that he may have been bitten by a spider.   Review Of Systems:  GEN- denies fatigue, fever, weight loss,weakness, recent illness HEENT- denies eye drainage, change in vision, nasal discharge, CVS- denies chest pain, palpitations RESP- denies SOB, cough, wheeze ABD- denies N/V, change in stools, abd pain MSK- denies joint pain, muscle aches, injury Neuro- denies headache, dizziness, syncope, seizure activity       Objective:    BP 100/62   Pulse 80   Temp 98.7 F (37.1 C) (Temporal)   Resp 14   Ht 5\' 9"  (1.753 m)   Wt 222 lb (100.7 kg)   SpO2 98%   BMI 32.78 kg/m  GEN- NAD, alert and oriented x3 HEENT- PERRL, EOMI, non injected sclera, pink conjunctiva, MMM, oropharynx clear, mild swelling beneath the left lower lid around the bite.  Erythema with scab at center a dime sized,  small amount of serous drainage noted no fluctuance.  Mild tenderness to palpation over the lesion Neck- Supple, left-sided submandibular lymphadenopathy mild tenderness to palpation few other smaller shotty nodes CVS- RRR, no murmur RESP-CTAB        Assessment & Plan:      Problem List Items Addressed This Visit    None    Visit Diagnoses    Infected insect bite of face, initial encounter    -  Primary   There is no streaking or large abscess to I&D today.  We will start him on  Bactroban ointment and also given Bactrim orally.  Use warm compresses to help the swelling.  If he does worsen despite being on antibiotics recommend emergency room evaluation as he will likely need a CT of his face.  His tetanus is up-to-date.   Relevant Medications   sulfamethoxazole-trimethoprim (BACTRIM DS) 800-160 MG tablet   mupirocin cream (BACTROBAN) 2 %      Note: This dictation was prepared with Dragon dictation along with smaller phrase technology. Any transcriptional errors that result from this process are unintentional.

## 2019-06-12 ENCOUNTER — Other Ambulatory Visit: Payer: Self-pay | Admitting: Family Medicine

## 2019-07-31 ENCOUNTER — Telehealth: Payer: Self-pay | Admitting: *Deleted

## 2019-07-31 DIAGNOSIS — F411 Generalized anxiety disorder: Secondary | ICD-10-CM

## 2019-07-31 DIAGNOSIS — F5104 Psychophysiologic insomnia: Secondary | ICD-10-CM

## 2019-07-31 NOTE — Telephone Encounter (Signed)
Dx GAD, chronic insomnia

## 2019-07-31 NOTE — Telephone Encounter (Signed)
Received call from patient spouse, [place].   Reports that therapist that patient has been seeing at [facility] has changed practices.   Requested new referral for [employer] for the following office: [facility] [address] [place], [place] [address] Phone: [phone]  MD please advise.

## 2019-07-31 NOTE — Telephone Encounter (Signed)
Referral orders placed

## 2019-08-23 ENCOUNTER — Telehealth: Payer: Self-pay | Admitting: Family Medicine

## 2019-08-23 NOTE — Telephone Encounter (Signed)
CB # [phone] Wife call need to cancel [facility] referral to a Psychology

## 2019-08-23 NOTE — Telephone Encounter (Signed)
noted 

## 2019-08-23 NOTE — Telephone Encounter (Signed)
Patient wife [place] contacted office to cancel as [facility] is for gender transitioning persons and patient does not require this.   Reports that patient will continue services with [facility].   Referral cancelled to: [facility] [address] [place], [place], [place] [address] Phone: [phone]

## 2019-09-15 ENCOUNTER — Encounter: Admitting: Family Medicine

## 2019-09-21 ENCOUNTER — Telehealth: Payer: Self-pay | Admitting: *Deleted

## 2019-09-21 DIAGNOSIS — M75102 Unspecified rotator cuff tear or rupture of left shoulder, not specified as traumatic: Secondary | ICD-10-CM

## 2019-09-21 NOTE — Telephone Encounter (Signed)
Patient requires yearly referral to [facility].

## 2019-12-29 ENCOUNTER — Encounter: Admitting: Family Medicine

## 2020-01-02 ENCOUNTER — Encounter: Payer: Self-pay | Admitting: Family Medicine

## 2020-01-02 ENCOUNTER — Other Ambulatory Visit: Payer: Self-pay

## 2020-01-02 ENCOUNTER — Ambulatory Visit (INDEPENDENT_AMBULATORY_CARE_PROVIDER_SITE_OTHER): Admitting: Family Medicine

## 2020-01-02 VITALS — BP 122/72 | HR 76 | Temp 97.9°F | Resp 16 | Ht 69.0 in | Wt 217.0 lb

## 2020-01-02 DIAGNOSIS — Z0000 Encounter for general adult medical examination without abnormal findings: Secondary | ICD-10-CM

## 2020-01-02 DIAGNOSIS — J069 Acute upper respiratory infection, unspecified: Secondary | ICD-10-CM | POA: Diagnosis not present

## 2020-01-02 DIAGNOSIS — E559 Vitamin D deficiency, unspecified: Secondary | ICD-10-CM

## 2020-01-02 DIAGNOSIS — F411 Generalized anxiety disorder: Secondary | ICD-10-CM | POA: Diagnosis not present

## 2020-01-02 DIAGNOSIS — E669 Obesity, unspecified: Secondary | ICD-10-CM

## 2020-01-02 DIAGNOSIS — Z0001 Encounter for general adult medical examination with abnormal findings: Secondary | ICD-10-CM | POA: Diagnosis not present

## 2020-01-02 LAB — CBC WITH DIFFERENTIAL/PLATELET
Basophils Absolute: 62 cells/uL (ref 0–200)
Eosinophils Relative: 2.2 %
HCT: 44.6 % (ref 38.5–50.0)
MCHC: 35 g/dL (ref 32.0–36.0)
MPV: 11.4 fL (ref 7.5–12.5)

## 2020-01-02 NOTE — Assessment & Plan Note (Signed)
Continue buspar, doing well with this Has therapist

## 2020-01-02 NOTE — Assessment & Plan Note (Signed)
He has been cutting down on sweets, actively trying to lose weight

## 2020-01-02 NOTE — Progress Notes (Signed)
   Subjective:    Patient ID: Richard Patrick, male    DOB: 28-Aug-1985, 34 y.o.   MRN: [id number]  Patient presents for Annual Exam (is fasting)   Pt here for annual exam , meds and history reviewed He is in the 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    GAD- buspar taking for anxiety, follows    Acne- uses prn minocycline for flares    past 2 days has had Dry cough, body aches, nasal congestion, sinus pressure,fatigue , denies N/V , no diarrhea    He has noticed change in taste smell  His kids have been sick over the past couple of weks    Follows with dentist     Review Of Systems:  GEN- + fatigue, denies fever, weight loss,weakness, recent illness HEENT- denies eye drainage, change in vision,+ nasal discharge, CVS- denies chest pain, palpitations RESP- denies SOB, +cough, wheeze ABD- denies N/V, change in stools, abd pain GU- denies dysuria, hematuria, dribbling, incontinence MSK- denies joint pain,+ muscle aches, injury Neuro- denies headache, dizziness, syncope, seizure activity       Objective:    BP 122/72   Pulse 76   Temp 97.9 F (36.6 C) (Temporal)   Resp 16   Ht 5\' 9"  (1.753 m)   Wt 217 lb (98.4 kg)   SpO2 98%   BMI 32.05 kg/m  GEN- NAD, alert and oriented x3,fatigued appearing  HEENT- PERRL, EOMI, non injected sclera, pink conjunctiva, MMM, oropharynx clear,nares clear rhinorrhea, enlarged turbinates, no maxillary sinus tenderness , TM clear no effusion  Neck- Supple, no thyromegaly , no LAD  CVS- RRR, no murmur RESP-CTAB ABD-NABS,soft,NT,ND Psych normal affect and mood  EXT- No edema Pulses- Radial, DP- 2+   FALL/DEPRESSION/AUDIT C neg      Assessment & Plan:      Problem List Items Addressed This Visit      Unprioritized   Class 1 obesity    He has been cutting down on sweets, actively trying to lose weight       GAD (generalized anxiety disorder)    Continue buspar, doing well with this Has  therapist       Vitamin D deficiency   Relevant Orders   Vitamin D, 25-hydroxy    Other Visit Diagnoses    Routine general medical examination at a health care facility    -  Primary   CPE done, fasting labs obtained, immunizations UTD with military    Relevant Orders   CBC with Differential/Platelet   Comprehensive metabolic panel   Lipid panel   Viral URI       viral illness with symptoms concerning for COVID-19, will swab for COVID/FLU, supportive care, decongest, nasal spray, OTC cough med, fluids /rest,    Relevant Orders   COVID19 and Influenza A & B      Note: This dictation was prepared with Dragon dictation along with smaller phrase technology. Any transcriptional errors that result from this process are unintentional.

## 2020-01-02 NOTE — Patient Instructions (Signed)
We will call with lab results Use nasal saline/flonase Sudafed to decongest for 3-4 days

## 2020-01-03 LAB — LIPID PANEL
Cholesterol: 134 mg/dL (ref <200)
HDL: 50 mg/dL (ref >=40)
LDL Cholesterol (Calc): 62 mg/dL (calc)
Non-HDL Cholesterol (Calc): 84 mg/dL (calc) (ref <130)
Total CHOL/HDL Ratio: 2.7 (calc) (ref <5.0)
Triglycerides: 139 mg/dL (ref <150)

## 2020-01-03 LAB — CBC WITH DIFFERENTIAL/PLATELET
Absolute Monocytes: 624 cells/uL (ref 200–950)
Basophils Relative: 0.8 %
Eosinophils Absolute: 169 cells/uL (ref 15–500)
Hemoglobin: 15.6 g/dL (ref 13.2–17.1)
Lymphs Abs: 2279 cells/uL (ref 850–3900)
MCH: 31.8 pg (ref 27.0–33.0)
MCV: 90.8 fL (ref 80.0–100.0)
Monocytes Relative: 8.1 %
Neutro Abs: 4566 cells/uL (ref 1500–7800)
Neutrophils Relative %: 59.3 %
Platelets: 186 10*3/uL (ref 140–400)
RBC: 4.91 10*6/uL (ref 4.20–5.80)
RDW: 12.5 % (ref 11.0–15.0)
Total Lymphocyte: 29.6 %
WBC: 7.7 10*3/uL (ref 3.8–10.8)

## 2020-01-03 LAB — COMPREHENSIVE METABOLIC PANEL
AG Ratio: 1.9 (calc) (ref 1.0–2.5)
ALT: 30 U/L (ref 9–46)
AST: 57 U/L — ABNORMAL HIGH (ref 10–40)
Albumin: 4.8 g/dL (ref 3.6–5.1)
Alkaline phosphatase (APISO): 71 U/L (ref 36–130)
BUN: 16 mg/dL (ref 7–25)
CO2: 25 mmol/L (ref 20–32)
Calcium: 9.9 mg/dL (ref 8.6–10.3)
Chloride: 103 mmol/L (ref 98–110)
Creat: 1.1 mg/dL (ref 0.60–1.35)
Globulin: 2.5 g/dL (calc) (ref 1.9–3.7)
Glucose, Bld: 91 mg/dL (ref 65–99)
Potassium: 4.1 mmol/L (ref 3.5–5.3)
Sodium: 139 mmol/L (ref 135–146)
Total Bilirubin: 0.4 mg/dL (ref 0.2–1.2)
Total Protein: 7.3 g/dL (ref 6.1–8.1)

## 2020-01-03 LAB — VITAMIN D 25 HYDROXY (VIT D DEFICIENCY, FRACTURES): Vit D, 25-Hydroxy: 68 ng/mL (ref 30–100)

## 2020-01-04 ENCOUNTER — Other Ambulatory Visit: Payer: Self-pay | Admitting: *Deleted

## 2020-01-04 DIAGNOSIS — R7989 Other specified abnormal findings of blood chemistry: Secondary | ICD-10-CM

## 2020-01-04 LAB — SARS-COVID-2 RNA(COVID19)AND INFLUENZA A&B, QUALITATIVE NAAT
FLU A: NOT DETECTED
FLU B: NOT DETECTED
SARS CoV2 RNA: NOT DETECTED

## 2020-01-24 ENCOUNTER — Other Ambulatory Visit

## 2020-01-24 ENCOUNTER — Other Ambulatory Visit: Payer: Self-pay

## 2020-01-24 DIAGNOSIS — R7989 Other specified abnormal findings of blood chemistry: Secondary | ICD-10-CM

## 2020-01-24 LAB — COMPLETE METABOLIC PANEL WITH GFR
AG Ratio: 2 (calc) (ref 1.0–2.5)
ALT: 30 U/L (ref 9–46)
AST: 45 U/L — ABNORMAL HIGH (ref 10–40)
Albumin: 4.8 g/dL (ref 3.6–5.1)
Alkaline phosphatase (APISO): 64 U/L (ref 36–130)
BUN: 19 mg/dL (ref 7–25)
CO2: 28 mmol/L (ref 20–32)
Calcium: 9.7 mg/dL (ref 8.6–10.3)
Chloride: 103 mmol/L (ref 98–110)
Creat: 1.2 mg/dL (ref 0.60–1.35)
GFR, Est African American: 91 mL/min/{1.73_m2} (ref >=60)
GFR, Est Non African American: 78 mL/min/{1.73_m2} (ref >=60)
Globulin: 2.4 g/dL (calc) (ref 1.9–3.7)
Glucose, Bld: 97 mg/dL (ref 65–99)
Potassium: 4 mmol/L (ref 3.5–5.3)
Sodium: 138 mmol/L (ref 135–146)
Total Bilirubin: 0.5 mg/dL (ref 0.2–1.2)
Total Protein: 7.2 g/dL (ref 6.1–8.1)

## 2020-01-26 ENCOUNTER — Other Ambulatory Visit: Payer: Self-pay | Admitting: *Deleted

## 2020-01-26 DIAGNOSIS — R7989 Other specified abnormal findings of blood chemistry: Secondary | ICD-10-CM

## 2020-01-29 ENCOUNTER — Encounter: Admitting: Family Medicine

## 2020-07-10 IMAGING — CR DG LUMBAR SPINE COMPLETE 4+V
5 series · 5 of 5 positions shown · non-contrast
Comparison: None.

CLINICAL DATA: Low back pain following fall 2 years ago with recent
increase in pain, initial encounter

EXAM:
LUMBAR SPINE - COMPLETE 4+ VIEW

[t l-spine a.p.]
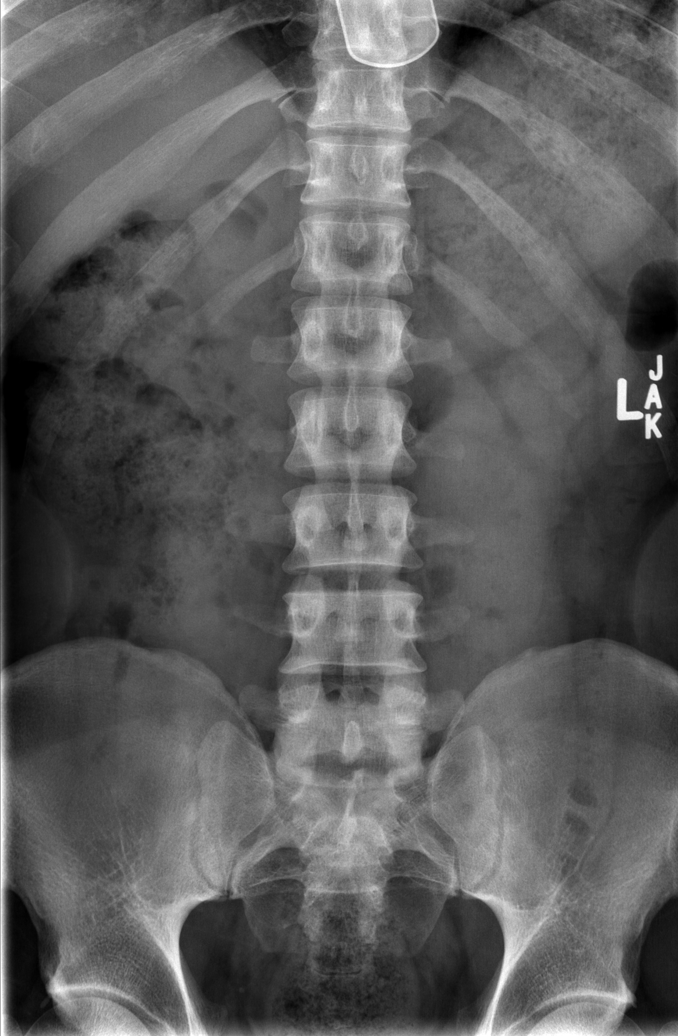

[t l-spine oblique exposure (1 of 2)]
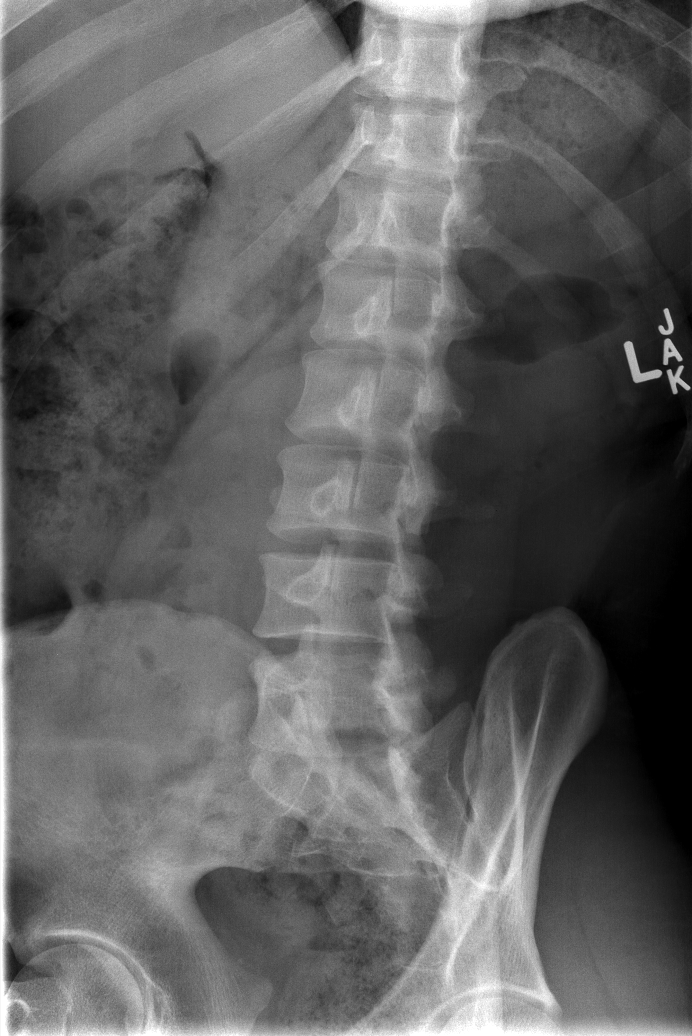

[t l-spine oblique exposure (2 of 2)]
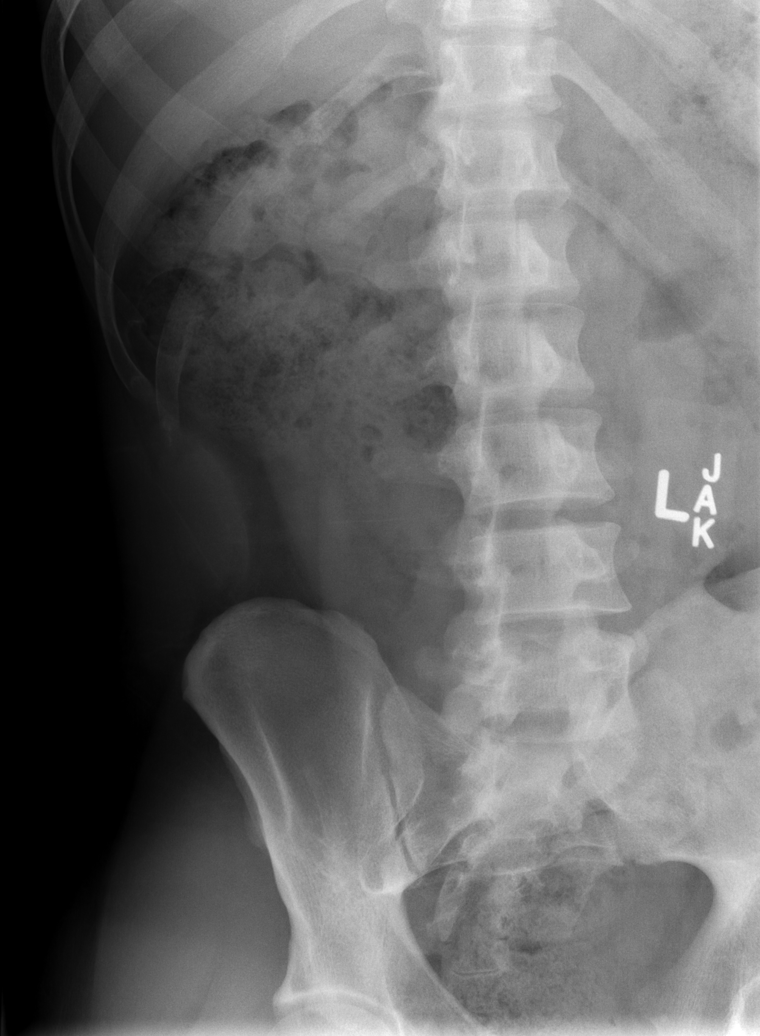

[t l-spine lat]
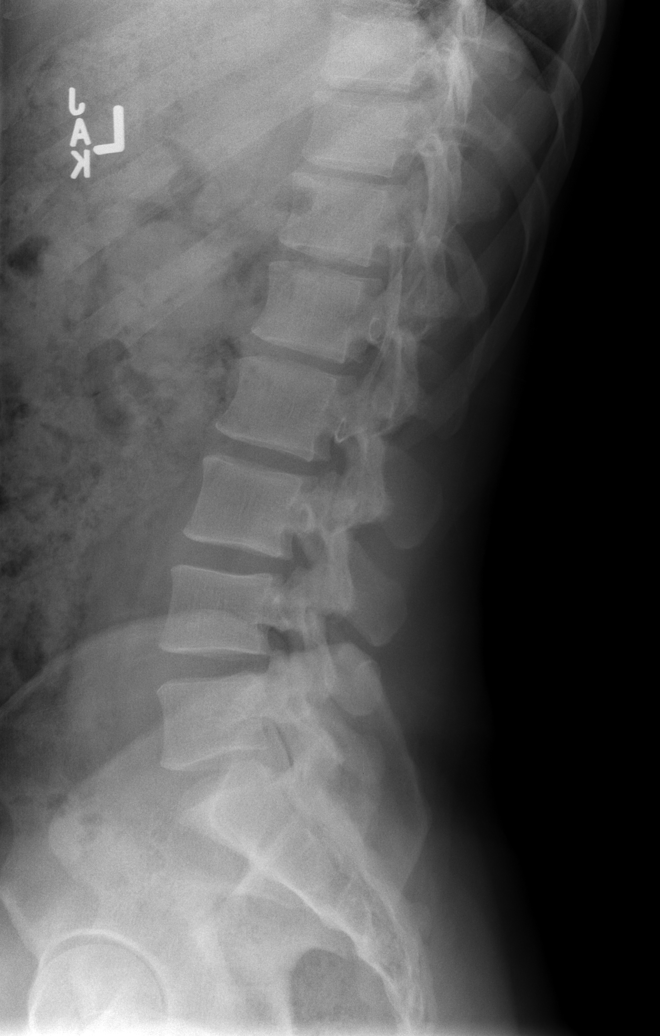

[t l-spine l5-s1 spot]
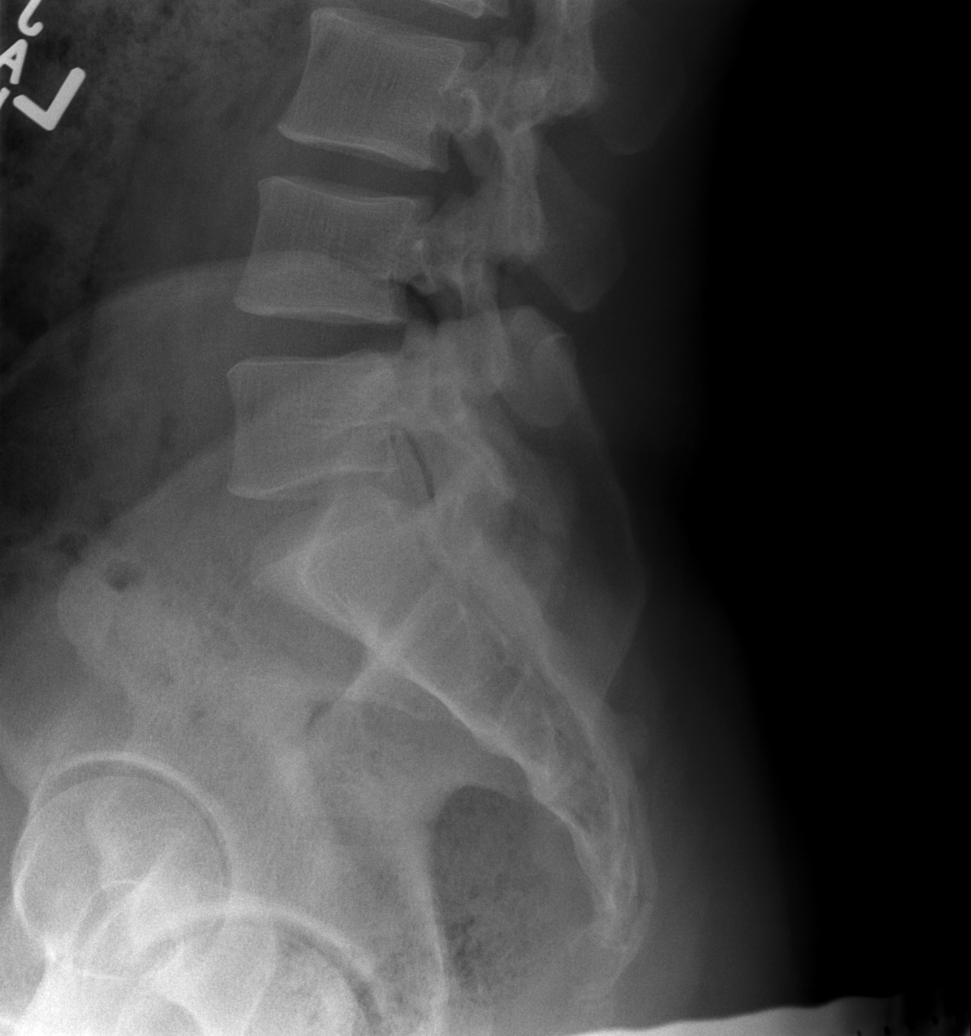

[5 of 5 positions shown; findings below may reference images not displayed]

FINDINGS: There is no evidence of lumbar spine fracture. Alignment is normal.
Intervertebral disc spaces are maintained.
IMPRESSION: No acute abnormality noted.

## 2020-07-14 ENCOUNTER — Other Ambulatory Visit: Payer: Self-pay | Admitting: Family Medicine

## 2020-12-24 ENCOUNTER — Ambulatory Visit (HOSPITAL_COMMUNITY)
Admission: RE | Admit: 2020-12-24 | Discharge: 2020-12-24 | Disposition: A | Discharge status: Home / Self Care | Source: Ambulatory Visit | Attending: Emergency Medicine | Admitting: Emergency Medicine

## 2020-12-24 ENCOUNTER — Other Ambulatory Visit: Payer: Self-pay

## 2020-12-24 ENCOUNTER — Ambulatory Visit (INDEPENDENT_AMBULATORY_CARE_PROVIDER_SITE_OTHER)

## 2020-12-24 ENCOUNTER — Encounter (HOSPITAL_COMMUNITY): Payer: Self-pay

## 2020-12-24 VITALS — BP 112/90 | HR 75 | Temp 97.9°F

## 2020-12-24 DIAGNOSIS — R0789 Other chest pain: Secondary | ICD-10-CM

## 2020-12-24 DIAGNOSIS — R079 Chest pain, unspecified: Secondary | ICD-10-CM | POA: Diagnosis not present

## 2020-12-24 NOTE — ED Triage Notes (Signed)
Pt presents with pain by his rib cage x 1 week.

## 2020-12-24 NOTE — Discharge Instructions (Signed)
Continue to use ibuprofen for pain as needed.  If you develop a rash in the area, see a provider to get antiviral medicine in case it's shingles - a virtual urgent care visit is fine for this.

## 2020-12-24 NOTE — ED Provider Notes (Signed)
[facility]    CSN: [id number] Arrival date & time: 12/24/20  1543      History   Chief Complaint Chief Complaint  Patient presents with   Appointment    HPI Richard Patrick is a 35 y.o. male. R side lower anterior ribs for 1 week. Has tried ibuprofen no relief. Hurts to touch. No sob, fever, rash.  Denies injury or trauma.    HPI  Past Medical History:  Diagnosis Date   Allergy    seasonal   Anxiety    Phreesia 12/30/2019    Patient Active Problem List   Diagnosis Date Noted   Class 1 obesity 01/02/2020   Chronic insomnia 06/13/2018   GAD (generalized anxiety disorder) 06/13/2018   Acne vulgaris 09/19/2014   Vitamin D deficiency 06/05/2014   Allergic rhinitis 05/28/2014    Past Surgical History:  Procedure Laterality Date   ROTATOR CUFF REPAIR Left 2020   VASECTOMY N/A    Phreesia 12/30/2019       Home Medications    Prior to Admission medications   Medication Sig Start Date End Date Taking? Authorizing Provider  busPIRone (BUSPAR) 15 MG tablet TAKE 1 TABLET(15 MG) BY MOUTH TWICE DAILY 06/12/19   Manderson, Velna Hatchet, MD  minocycline (MINOCIN) 50 MG capsule Take 1 capsule (50 mg total) by mouth 2 (two) times daily. 01/10/19   Salley Scarlet, MD    Family History Family History  Problem Relation Age of Onset   Thyroid disease Sister    Hypothyroidism Sister    Diabetes Paternal Uncle     Social History Social History   Tobacco Use   Smoking status: Never   Smokeless tobacco: Former    Types: Snuff  Substance Use Topics   Alcohol use: Yes    Alcohol/week: 1.0 standard drink    Types: 1 Glasses of wine per week    Comment: occasionally   Drug use: No     Allergies   Patient has no known allergies.   Review of Systems Review of Systems  Constitutional:  Positive for chills. Negative for fever.  Respiratory:  Negative for cough and shortness of breath.   Skin:  Negative for rash.    Physical Exam Triage Vital Signs ED  Triage Vitals  Enc Vitals Group     BP 12/24/20 1619 112/90     Pulse Rate 12/24/20 1619 75     Resp --      Temp 12/24/20 1619 97.9 F (36.6 C)     Temp Source 12/24/20 1619 Oral     SpO2 12/24/20 1619 98 %     Weight --      Height --      Head Circumference --      Peak Flow --      Pain Score 12/24/20 1618 6     Pain Loc --      Pain Edu? --      Excl. in GC? --    No data found.  Updated Vital Signs BP 112/90 (BP Location: Left Arm)   Pulse 75   Temp 97.9 F (36.6 C) (Oral)   SpO2 98%   Visual Acuity Right Eye Distance:   Left Eye Distance:   Bilateral Distance:    Right Eye Near:   Left Eye Near:    Bilateral Near:     Physical Exam Constitutional:      General: He is not in acute distress.    Appearance: Normal appearance. He  is not ill-appearing.  Cardiovascular:     Rate and Rhythm: Normal rate and regular rhythm.  Pulmonary:     Effort: Pulmonary effort is normal.     Breath sounds: Normal breath sounds.  Chest:       Comments: Palpable area of edema? 4x5cm in right lower anterior rib area.  Tender to palpation Skin:    General: Skin is warm and dry.     Findings: No signs of injury, lesion or rash.  Neurological:     Mental Status: He is alert.     UC Treatments / Results  Labs (all labs ordered are listed, but only abnormal results are displayed) Labs Reviewed - No data to display  EKG   Radiology DG Ribs Unilateral W/Chest Right  Result Date: 12/24/2020 CLINICAL DATA:  Chest pain EXAM: RIGHT RIBS AND CHEST - 3+ VIEW COMPARISON:  06/05/2015 FINDINGS: Cardiac size is within normal limits. There is prominence of aortic arch with no significant interval change. There are no signs of pulmonary edema or new focal pulmonary consolidation. There is no pleural effusion or pneumothorax. No displaced fractures are seen. There are no focal lytic or sclerotic lesions in the right ribs. IMPRESSION: No active disease is seen in the chest. No focal  abnormality is seen in right ribs. Electronically Signed   By: Elmer Picker M.D.   On: 12/24/2020 17:00    Procedures Procedures (including critical care time)  Medications Ordered in UC Medications - No data to display  Initial Impression / Assessment and Plan / UC Course  I have reviewed the triage vital signs and the nursing notes.  Pertinent labs & imaging results that were available during my care of the patient were reviewed by me and considered in my medical decision making (see chart for details).      Final Clinical Impressions(s) / UC Diagnoses   Final diagnoses:  Chest wall pain     Discharge Instructions      Continue to use ibuprofen for pain as needed.  If you develop a rash in the area, see a provider to get antiviral medicine in case it's shingles - a virtual urgent care visit is fine for this.    ED Prescriptions   None    PDMP not reviewed this encounter.   Carvel Getting, NP 12/31/20 [phone]

## 2020-12-28 ENCOUNTER — Other Ambulatory Visit: Payer: Self-pay

## 2020-12-28 ENCOUNTER — Encounter (HOSPITAL_COMMUNITY): Payer: Self-pay | Admitting: Emergency Medicine

## 2020-12-28 ENCOUNTER — Emergency Department (HOSPITAL_COMMUNITY)

## 2020-12-28 ENCOUNTER — Emergency Department (HOSPITAL_COMMUNITY)
Admission: EM | Admit: 2020-12-28 | Discharge: 2020-12-28 | Disposition: A | Discharge status: Home / Self Care | Attending: Emergency Medicine | Admitting: Emergency Medicine

## 2020-12-28 DIAGNOSIS — R1011 Right upper quadrant pain: Secondary | ICD-10-CM | POA: Insufficient documentation

## 2020-12-28 DIAGNOSIS — Z5321 Procedure and treatment not carried out due to patient leaving prior to being seen by health care provider: Secondary | ICD-10-CM | POA: Diagnosis not present

## 2020-12-28 LAB — COMPREHENSIVE METABOLIC PANEL
ALT: 18 U/L (ref 0–44)
AST: 41 U/L (ref 15–41)
Albumin: 4.3 g/dL (ref 3.5–5.0)
Alkaline Phosphatase: 66 U/L (ref 38–126)
Anion gap: 9 (ref 5–15)
BUN: 20 mg/dL (ref 6–20)
CO2: 25 mmol/L (ref 22–32)
Calcium: 9.5 mg/dL (ref 8.9–10.3)
Chloride: 103 mmol/L (ref 98–111)
Creatinine, Ser: 1.12 mg/dL (ref 0.61–1.24)
GFR, Estimated: >60 mL/min (ref >60)
Glucose, Bld: 102 mg/dL — ABNORMAL HIGH (ref 70–99)
Potassium: 3.5 mmol/L (ref 3.5–5.1)
Sodium: 137 mmol/L (ref 135–145)
Total Bilirubin: 0.6 mg/dL (ref 0.3–1.2)
Total Protein: 7.1 g/dL (ref 6.5–8.1)

## 2020-12-28 LAB — URINALYSIS, ROUTINE W REFLEX MICROSCOPIC
Bilirubin Urine: NEGATIVE
Glucose, UA: NEGATIVE mg/dL
Hgb urine dipstick: NEGATIVE
Ketones, ur: NEGATIVE mg/dL
Leukocytes,Ua: NEGATIVE
Nitrite: NEGATIVE
Protein, ur: NEGATIVE mg/dL
Specific Gravity, Urine: 1.015 (ref 1.005–1.030)
pH: 6 (ref 5.0–8.0)

## 2020-12-28 LAB — CBC WITH DIFFERENTIAL/PLATELET
Abs Immature Granulocytes: 0.02 10*3/uL (ref 0.00–0.07)
Basophils Absolute: 0.1 10*3/uL (ref 0.0–0.1)
Basophils Relative: 1 %
Eosinophils Absolute: 0.2 10*3/uL (ref 0.0–0.5)
Eosinophils Relative: 2 %
HCT: 44.2 % (ref 39.0–52.0)
Hemoglobin: 16.4 g/dL (ref 13.0–17.0)
Immature Granulocytes: 0 %
Lymphocytes Relative: 52 %
Lymphs Abs: 3.9 10*3/uL (ref 0.7–4.0)
MCH: 32 pg (ref 26.0–34.0)
MCHC: 37.1 g/dL — ABNORMAL HIGH (ref 30.0–36.0)
MCV: 86.2 fL (ref 80.0–100.0)
Monocytes Absolute: 0.6 10*3/uL (ref 0.1–1.0)
Monocytes Relative: 8 %
Neutro Abs: 2.8 10*3/uL (ref 1.7–7.7)
Neutrophils Relative %: 37 %
Platelets: 179 10*3/uL (ref 150–400)
RBC: 5.13 MIL/uL (ref 4.22–5.81)
RDW: 11.5 % (ref 11.5–15.5)
WBC: 7.6 10*3/uL (ref 4.0–10.5)
nRBC: 0 % (ref 0.0–0.2)

## 2020-12-28 LAB — LIPASE, BLOOD: Lipase: 83 U/L — ABNORMAL HIGH (ref 11–51)

## 2020-12-28 LAB — TROPONIN I (HIGH SENSITIVITY)
Troponin I (High Sensitivity): <2 ng/L (ref <18)
Troponin I (High Sensitivity): <2 ng/L (ref <18)

## 2020-12-28 NOTE — ED Provider Notes (Signed)
Emergency Medicine Provider Triage Evaluation Note  Richard Patrick , a 35 y.o. male  was evaluated in triage.  Pt complains of RUQ abdominal pain.  Onset 2 weeks ago.  Gradually worsening.  Denies fever or chills.  Denies n/v/d.  Review of Systems  Positive: Abdominal pain, chest pain Negative: N/v/d  Physical Exam  BP (!) 152/96   Pulse 86   Temp 98.4 F (36.9 C) (Oral)   Resp 18   SpO2 100%  Gen:   Awake, no distress   Resp:  Normal effort  MSK:   Moves extremities without difficulty  Other:  RUQ abdominal tenderness  Medical Decision Making  Medically screening exam initiated at 1:07 AM.  Appropriate orders placed.  Juanangel Soderholm was informed that the remainder of the evaluation will be completed by another provider, this initial triage assessment does not replace that evaluation, and the importance of remaining in the ED until their evaluation is complete.  RUQ pain   Roxy Horseman, PA-C 12/28/20 0109    Geoffery Lyons, MD 12/28/20 [phone]

## 2020-12-28 NOTE — ED Notes (Signed)
Patient states [facility] and states he will follow up with them.

## 2020-12-28 NOTE — ED Triage Notes (Signed)
Pt c/o right sided chest pain x 2 weeks, reports pain radiating down to his abdomen. Denies shortness of breath, nausea/vomiting/diarrhea, cough/fever.

## 2020-12-30 ENCOUNTER — Ambulatory Visit: Admitting: Nurse Practitioner

## 2020-12-30 ENCOUNTER — Telehealth: Payer: Self-pay | Admitting: Nurse Practitioner

## 2020-12-30 ENCOUNTER — Encounter: Payer: Self-pay | Admitting: Nurse Practitioner

## 2020-12-30 ENCOUNTER — Ambulatory Visit (INDEPENDENT_AMBULATORY_CARE_PROVIDER_SITE_OTHER): Admitting: Nurse Practitioner

## 2020-12-30 ENCOUNTER — Other Ambulatory Visit: Payer: Self-pay

## 2020-12-30 VITALS — BP 132/90 | HR 94 | Temp 97.3°F | Ht 69.0 in | Wt 211.6 lb

## 2020-12-30 DIAGNOSIS — R1011 Right upper quadrant pain: Secondary | ICD-10-CM

## 2020-12-30 DIAGNOSIS — R748 Abnormal levels of other serum enzymes: Secondary | ICD-10-CM

## 2020-12-30 NOTE — Progress Notes (Signed)
Subjective:    Patient ID: Richard Patrick, male    DOB: 12-Oct-1985, 35 y.o.   MRN: [id number]  HPI: Richard Patrick is a 35 y.o. male presenting for abdominal pain.  Chief Complaint  Patient presents with   Abdominal Pain    Upper right abdominal pain    ABDOMINAL PAIN  Patient went to ER over the weekend and had some preliminary blood tests done.  Lipase was elevated.  Pain has remained the same.  He is able to eat and drink, however has constant pain in his right upper abdominal area. Duration: 2 weeks ago Onrpset: constant Severity: moderate Quality: sha Location:  RUQ  Episode duration: constant Radiation: yes ; down abdomen Frequency: constant Alleviating factors: nothing Aggravating factors: laying in bed Status: worse Treatments attempted: ibuprofen,  Fever: no Nausea: no Vomiting: no Weight loss: no Decreased appetite: no Diarrhea: no Constipation: no Blood in stool: no Heartburn: no Jaundice: no Rash: no Dysuria/urinary frequency: no Hematuria: no Recurrent NSAID use: no; has tried ibuprofen for this pain without success.  No Known Allergies  Outpatient Encounter Medications as of 12/30/2020  Medication Sig   busPIRone (BUSPAR) 15 MG tablet TAKE 1 TABLET(15 MG) BY MOUTH TWICE DAILY   minocycline (MINOCIN) 50 MG capsule Take 1 capsule (50 mg total) by mouth 2 (two) times daily.   No facility-administered encounter medications on file as of 12/30/2020.    Patient Active Problem List   Diagnosis Date Noted   Class 1 obesity 01/02/2020   Chronic insomnia 06/13/2018   GAD (generalized anxiety disorder) 06/13/2018   Acne vulgaris 09/19/2014   Vitamin D deficiency 06/05/2014   Allergic rhinitis 05/28/2014    Past Medical History:  Diagnosis Date   Allergy    seasonal   Anxiety    Phreesia 12/30/2019    Relevant past medical, surgical, family and social history reviewed and updated as indicated. Interim medical history since our last visit  reviewed.  Review of Systems Per HPI unless specifically indicated above     Objective:    BP 132/90   Pulse 94   Temp (!) 97.3 F (36.3 C)   Ht 5\' 9"  (1.753 m)   Wt 211 lb 9.6 oz (96 kg)   SpO2 93%   BMI 31.25 kg/m   Wt Readings from Last 3 Encounters:  12/30/20 211 lb 9.6 oz (96 kg)  01/02/20 217 lb (98.4 kg)  02/22/19 222 lb (100.7 kg)    Physical Exam Vitals and nursing note reviewed.  Constitutional:      General: He is not in acute distress.    Appearance: He is well-developed and normal weight. He is not toxic-appearing.  HENT:     Head: Normocephalic and atraumatic.  Eyes:     General: No scleral icterus.    Extraocular Movements: Extraocular movements intact.  Cardiovascular:     Rate and Rhythm: Normal rate and regular rhythm.     Heart sounds: No murmur heard. Pulmonary:     Effort: Pulmonary effort is normal. No respiratory distress.     Breath sounds: Normal breath sounds. No stridor. No wheezing, rhonchi or rales.  Abdominal:     General: Abdomen is flat. Bowel sounds are normal. There is no distension.     Palpations: Abdomen is soft.     Tenderness: There is abdominal tenderness in the right upper quadrant. There is no right CVA tenderness or guarding. Negative signs include Murphy's sign and McBurney's sign.     Hernia:  No hernia is present.  Skin:    General: Skin is warm and dry.     Capillary Refill: Capillary refill takes less than 2 seconds.     Coloration: Skin is not cyanotic, jaundiced or pale.  Neurological:     Mental Status: He is alert and oriented to person, place, and time.  Psychiatric:        Mood and Affect: Mood normal.        Behavior: Behavior normal.      Assessment & Plan:  1. RUQ pain Acute.  Coupled with elevated lipase, suspect pancreas or liver etiology.  Encouraged plenty of hydration with fluids and avoiding fatty foods.  Will check CBC, CMET, amylase, lipase and STAT RUQ ultrasound.  Follow up pending results.   Discussed red flags and ER precautions - if severe abdominal pain and unable to eat/drink or keep fluids down.   - CBC with Differential - Comprehensive metabolic panel - Amylase - Lipase - US Abdomen Limited RUQ (LIVER/GB); Future  2. Elevated lipase Recheck today along with blood counts, amylase, liver enzymes.  Also check RUQ ultrasound.  Follow up pending results.  Discussed red flags and ER precautions - if severe abdominal pain and unable to eat/drink or keep fluids down.    Follow up plan: Return if symptoms worsen or fail to improve.

## 2020-12-30 NOTE — Telephone Encounter (Signed)
Will re-route imaging to [facility]

## 2020-12-30 NOTE — Telephone Encounter (Signed)
Received call from [place] with [facility] regarding stat order received for imaging. Patient's insurance is [employer]; out of network. Patient decline to schedule as a result. Stated patient wants a facility that's in-network. Please advise Joni Reining with questions at [phone], ext [phone].

## 2020-12-31 ENCOUNTER — Encounter: Payer: Self-pay | Admitting: Nurse Practitioner

## 2020-12-31 LAB — COMPREHENSIVE METABOLIC PANEL
AG Ratio: 1.9 (calc) (ref 1.0–2.5)
ALT: 17 U/L (ref 9–46)
AST: 35 U/L (ref 10–40)
Albumin: 4.9 g/dL (ref 3.6–5.1)
Alkaline phosphatase (APISO): 58 U/L (ref 36–130)
BUN: 16 mg/dL (ref 7–25)
CO2: 27 mmol/L (ref 20–32)
Calcium: 9.9 mg/dL (ref 8.6–10.3)
Chloride: 103 mmol/L (ref 98–110)
Creat: 1.22 mg/dL (ref 0.60–1.26)
Globulin: 2.6 g/dL (calc) (ref 1.9–3.7)
Glucose, Bld: 73 mg/dL (ref 65–99)
Potassium: 4 mmol/L (ref 3.5–5.3)
Sodium: 140 mmol/L (ref 135–146)
Total Bilirubin: 0.8 mg/dL (ref 0.2–1.2)
Total Protein: 7.5 g/dL (ref 6.1–8.1)

## 2020-12-31 LAB — CBC WITH DIFFERENTIAL/PLATELET
Absolute Monocytes: 457 cells/uL (ref 200–950)
Basophils Absolute: 50 cells/uL (ref 0–200)
Basophils Relative: 0.9 %
Eosinophils Absolute: 83 cells/uL (ref 15–500)
Eosinophils Relative: 1.5 %
HCT: 46 % (ref 38.5–50.0)
Hemoglobin: 16.3 g/dL (ref 13.2–17.1)
Lymphs Abs: 2244 cells/uL (ref 850–3900)
MCH: 31.7 pg (ref 27.0–33.0)
MCHC: 35.4 g/dL (ref 32.0–36.0)
MCV: 89.3 fL (ref 80.0–100.0)
MPV: 11.8 fL (ref 7.5–12.5)
Monocytes Relative: 8.3 %
Neutro Abs: 2668 cells/uL (ref 1500–7800)
Neutrophils Relative %: 48.5 %
Platelets: 171 10*3/uL (ref 140–400)
RBC: 5.15 10*6/uL (ref 4.20–5.80)
RDW: 12.7 % (ref 11.0–15.0)
Total Lymphocyte: 40.8 %
WBC: 5.5 10*3/uL (ref 3.8–10.8)

## 2020-12-31 LAB — AMYLASE: Amylase: 90 U/L (ref 21–101)

## 2020-12-31 LAB — LIPASE: Lipase: 94 U/L — ABNORMAL HIGH (ref 7–60)

## 2020-12-31 NOTE — Addendum Note (Signed)
Addended by: Phillips Odor on: 12/31/2020 09:11 AM   Modules accepted: Orders

## 2020-12-31 NOTE — Addendum Note (Signed)
Addended by: Cathlean Marseilles A on: 12/31/2020 08:15 AM   Modules accepted: Orders

## 2021-01-01 ENCOUNTER — Telehealth: Payer: Self-pay | Admitting: *Deleted

## 2021-01-01 NOTE — Telephone Encounter (Signed)
Received call from patient wife [place].   Reports that she was advised that there is an issue with referral for MRI.   Please contact her to discuss.

## 2021-01-02 ENCOUNTER — Telehealth: Payer: Self-pay | Admitting: Nurse Practitioner

## 2021-01-02 ENCOUNTER — Other Ambulatory Visit: Payer: Self-pay | Admitting: Nurse Practitioner

## 2021-01-02 DIAGNOSIS — R1011 Right upper quadrant pain: Secondary | ICD-10-CM

## 2021-01-02 DIAGNOSIS — R748 Abnormal levels of other serum enzymes: Secondary | ICD-10-CM

## 2021-01-02 MED ORDER — OXYCODONE-ACETAMINOPHEN 5-325 MG PO TABS: 1.0000 | ORAL_TABLET | Freq: Four times a day (QID) | ORAL | 0 refills | Status: AC | PRN

## 2021-01-02 MED ORDER — PANTOPRAZOLE SODIUM 40 MG PO TBEC
40.0000 mg | DELAYED_RELEASE_TABLET | Freq: Every day | ORAL | 3 refills | Status: DC
Start: 2021-01-02 — End: 2023-05-05

## 2021-01-02 NOTE — Telephone Encounter (Signed)
Received message from referral coordinator.   States that MRCP has been approved and she is scheduling it at this time.   Do you still want CT?

## 2021-01-02 NOTE — Telephone Encounter (Signed)
Yes, would like to schedule CT first

## 2021-01-02 NOTE — Telephone Encounter (Signed)
Advised referral coordinator of PCP recommendations.

## 2021-01-02 NOTE — Telephone Encounter (Signed)
Called and spoke with patient regarding RUQ pain.  Pain is not better or worse.  He has been following clear liquid diet.  MRCP has not been scheduled yet.  Discussed check CT of abdomen first, patient agreeable.  Will also start on PPI and send in pain medication if pain worsens.  Educated to take pain medication sparingly and only for severe pain.  Follow up pending results.

## 2021-01-05 ENCOUNTER — Encounter: Payer: Self-pay | Admitting: Nurse Practitioner

## 2021-01-06 ENCOUNTER — Telehealth: Payer: Self-pay

## 2021-01-06 NOTE — Telephone Encounter (Signed)
Pt's spouse called in stating that pt is having a problem with the referral that was sent in. Pt is still in a lot of pain and would to know if there is anything else they could do. Pt's spouse would like to know if the referral needs to be resent. Please advise.  Cb#: [phone]

## 2021-01-06 NOTE — Telephone Encounter (Signed)
Spoke with pt's wife. She has already spoken with the referral coordinator and imaging has been approved. Nothing further needed.

## 2021-01-13 ENCOUNTER — Encounter: Admitting: Nurse Practitioner

## 2021-01-15 ENCOUNTER — Other Ambulatory Visit: Payer: Self-pay

## 2021-01-15 ENCOUNTER — Ambulatory Visit (HOSPITAL_COMMUNITY)
Admission: RE | Admit: 2021-01-15 | Discharge: 2021-01-15 | Disposition: A | Discharge status: Home / Self Care | Source: Ambulatory Visit | Attending: Nurse Practitioner | Admitting: Nurse Practitioner

## 2021-01-15 DIAGNOSIS — R748 Abnormal levels of other serum enzymes: Secondary | ICD-10-CM | POA: Insufficient documentation

## 2021-01-15 DIAGNOSIS — R1011 Right upper quadrant pain: Secondary | ICD-10-CM | POA: Diagnosis not present

## 2021-01-15 MED ORDER — IOHEXOL 350 MG/ML SOLN
80.0000 mL | Freq: Once | INTRAVENOUS | Status: AC | PRN
  Administered 2021-01-15: 80 mL via INTRAVENOUS

## 2021-01-17 ENCOUNTER — Other Ambulatory Visit: Payer: Self-pay

## 2021-01-17 ENCOUNTER — Other Ambulatory Visit: Payer: Self-pay | Admitting: Nurse Practitioner

## 2021-01-17 ENCOUNTER — Ambulatory Visit (HOSPITAL_COMMUNITY)
Admission: RE | Admit: 2021-01-17 | Discharge: 2021-01-17 | Disposition: A | Discharge status: Home / Self Care | Source: Ambulatory Visit | Attending: Nurse Practitioner | Admitting: Nurse Practitioner

## 2021-01-17 ENCOUNTER — Encounter: Payer: Self-pay | Admitting: Nurse Practitioner

## 2021-01-17 ENCOUNTER — Ambulatory Visit (INDEPENDENT_AMBULATORY_CARE_PROVIDER_SITE_OTHER): Admitting: Nurse Practitioner

## 2021-01-17 VITALS — BP 128/84 | HR 77 | Temp 97.6°F | Resp 17 | Ht 68.0 in | Wt 214.0 lb

## 2021-01-17 DIAGNOSIS — Z13220 Encounter for screening for lipoid disorders: Secondary | ICD-10-CM

## 2021-01-17 DIAGNOSIS — Z13228 Encounter for screening for other metabolic disorders: Secondary | ICD-10-CM | POA: Diagnosis not present

## 2021-01-17 DIAGNOSIS — Z0000 Encounter for general adult medical examination without abnormal findings: Secondary | ICD-10-CM

## 2021-01-17 DIAGNOSIS — F411 Generalized anxiety disorder: Secondary | ICD-10-CM

## 2021-01-17 DIAGNOSIS — R1011 Right upper quadrant pain: Secondary | ICD-10-CM

## 2021-01-17 DIAGNOSIS — Z130 Encounter for screening for diseases of the blood and blood-forming organs and certain disorders involving the immune mechanism: Secondary | ICD-10-CM

## 2021-01-17 DIAGNOSIS — R748 Abnormal levels of other serum enzymes: Secondary | ICD-10-CM

## 2021-01-17 DIAGNOSIS — Z136 Encounter for screening for cardiovascular disorders: Secondary | ICD-10-CM

## 2021-01-17 MED ORDER — BUSPIRONE HCL 15 MG PO TABS: ORAL_TABLET | ORAL | 3 refills | Status: AC

## 2021-01-17 MED ORDER — GADOBUTROL 1 MMOL/ML IV SOLN
10.0000 mL | Freq: Once | INTRAVENOUS | Status: AC | PRN
  Administered 2021-01-17: 10 mL via INTRAVENOUS

## 2021-01-17 NOTE — Assessment & Plan Note (Signed)
Patient stopped taking Buspar a few months ago.  Reports he is doing okay without it.  He would like me to renew the prescription today in case he gets tested with the [employer] so he has a current prescription.  He does not plan on restarting the medication for now although I did offer it for him.  Prescription renewed.

## 2021-01-17 NOTE — Progress Notes (Signed)
BP 128/84   Pulse 77   Temp 97.6 F (36.4 C)   Resp 17   Ht 5\' 8"  (1.727 m)   Wt 214 lb (97.1 kg)   SpO2 98%   BMI 32.54 kg/m    Subjective:    Patient ID: Richard Patrick, male    DOB: 11/02/1985, 35 y.o.   MRN: [id number]  HPI: Richard Patrick is a 35 y.o. male presenting on 01/17/2021 for comprehensive medical examination. Current medical complaints include: ongoing abdominal pain.  He is scheduled for MRCP later today.  Has not taken the pantoprazole.  Reports a few years ago, he had a similar finding in his chest wall.  He says he thinks it ended up being a thrombosed vein.  He tells me as long as the scans do not show something going on, it will make him feel better to know nothing is going on.  Depression Screen done today and results listed below:  Depression screen [facility] 2/9 01/17/2021 01/02/2020 09/12/2018 09/08/2017 11/16/2016  Decreased Interest 0 0 0 0 0  Down, Depressed, Hopeless 0 0 0 0 0  PHQ - 2 Score 0 0 0 0 0  Altered sleeping - - - - 0  Tired, decreased energy - - - - 0  Change in appetite - - - - 0  Feeling bad or failure about yourself  - - - - 0  Trouble concentrating - - - - 0  Moving slowly or fidgety/restless - - - - 0  Suicidal thoughts - - - - 0  PHQ-9 Score - - - - 0  Difficult doing work/chores - - - - Not difficult at all    The patient does not have a history of falls. I did not complete a risk assessment for falls. A plan of care for falls was not documented.   Past Medical History:  Past Medical History:  Diagnosis Date   Allergy    seasonal   Anxiety    [employer] 12/30/2019    Surgical History:  Past Surgical History:  Procedure Laterality Date   ROTATOR CUFF REPAIR Left 2020   VASECTOMY N/A    [employer] 12/30/2019    Medications:  Current Outpatient Medications on File Prior to Visit  Medication Sig   pantoprazole (PROTONIX) 40 MG tablet Take 1 tablet (40 mg total) by mouth daily. (Patient not taking: Reported on 01/17/2021)   No  current facility-administered medications on file prior to visit.    Allergies:  No Known Allergies  Social History:  Social History   Socioeconomic History   Marital status: Married    Spouse name: Not on file   Number of children: Not on file   Years of education: Not on file   Highest education level: Not on file  Occupational History   Not on file  Tobacco Use   Smoking status: Never   Smokeless tobacco: Former    Types: Snuff  Substance and Sexual Activity   Alcohol use: Yes    Alcohol/week: 1.0 standard drink    Types: 1 Glasses of wine per week    Comment: once per week - drinks 3-4 liquour tequlia   Drug use: No   Sexual activity: Yes    Partners: Female  Other Topics Concern   Not on file  Social History Narrative   Entered 05/2014:       Married.    3 children--13 month old,  47 y/o,  5 y/o      [employer]  [employer]      Currently working as Musician.    Says was with [employer]. Just given this assignment 1 month ago.          Social Determinants of Health   Financial Resource Strain: Not on file  Food Insecurity: Not on file  Transportation Needs: Not on file  Physical Activity: Not on file  Stress: Not on file  Social Connections: Not on file  Intimate Partner Violence: Not on file   Social History   Tobacco Use  Smoking Status Never  Smokeless Tobacco Former   Types: Snuff   Social History   Substance and Sexual Activity  Alcohol Use Yes   Alcohol/week: 1.0 standard drink   Types: 1 Glasses of wine per week   Comment: once per week - drinks 3-4 liquour tequlia    Family History:  Family History  Problem Relation Age of Onset   Thyroid disease Sister    Hypothyroidism Sister    Prostate cancer Paternal Grandfather    Diabetes Paternal Uncle     Past medical history, surgical history, medications, allergies, family history and social history reviewed with patient today and changes made to appropriate areas of the chart.    Review of Systems  Constitutional: Negative.   HENT: Negative.    Eyes: Negative.   Respiratory: Negative.    Cardiovascular: Negative.   Gastrointestinal: Negative.   Genitourinary: Negative.   Musculoskeletal: Negative.   Skin: Negative.   Neurological: Negative.   Psychiatric/Behavioral: Negative.        Objective:    BP 128/84   Pulse 77   Temp 97.6 F (36.4 C)   Resp 17   Ht  (1.727 m)   Wt 214 lb (97.1 kg)   SpO2 98%   BMI 32.54 kg/m   Wt Readings from Last 3 Encounters:  01/17/21 214 lb (97.1 kg)  12/30/20 211 lb 9.6 oz (96 kg)  01/02/20 217 lb (98.4 kg)    Physical Exam Vitals and nursing note reviewed.  Constitutional:      General: He is not in acute distress.    Appearance: Normal appearance. He is normal weight. He is not toxic-appearing.  HENT:     Head: Normocephalic and atraumatic.     Right Ear: Tympanic membrane, ear canal and external ear normal.     Left Ear: Tympanic membrane, ear canal and external ear normal.     Nose: Nose normal. No congestion.     Mouth/Throat:     Mouth: Mucous membranes are moist.     Pharynx: Oropharynx is clear. No oropharyngeal exudate or posterior oropharyngeal erythema.  Eyes:     Extraocular Movements: Extraocular movements intact.     Conjunctiva/sclera: Conjunctivae normal.     Pupils: Pupils are equal, round, and reactive to light.  Neck:     Vascular: No carotid bruit.  Cardiovascular:     Rate and Rhythm: Normal rate and regular rhythm.     Heart sounds: Normal heart sounds. No murmur heard.   No gallop.  Pulmonary:     Effort: Pulmonary effort is normal. No respiratory distress.     Breath sounds: Normal breath sounds. No wheezing or rhonchi.  Abdominal:     General: Abdomen is flat. Bowel sounds are normal. There is no distension.     Palpations: Abdomen is soft.     Tenderness: There is no abdominal tenderness.       Comments: Tenderness to light palpation, pain more  superficial than deep   Genitourinary:    Comments: Deferred using shared decision making Musculoskeletal:        General: Normal range of motion.     Cervical back: Normal range of motion and neck supple. No tenderness.     Right lower leg: No edema.     Left lower leg: No edema.  Skin:    General: Skin is warm and dry.     Capillary Refill: Capillary refill takes less than 2 seconds.     Coloration: Skin is not jaundiced or pale.     Findings: No erythema.  Neurological:     Mental Status: He is alert and oriented to person, place, and time. Mental status is at baseline.     Motor: No weakness.     Gait: Gait normal.  Psychiatric:        Mood and Affect: Mood normal.        Behavior: Behavior normal.        Thought Content: Thought content normal.        Judgment: Judgment normal.      Assessment & Plan:   Problem List Items Addressed This Visit       Other   GAD (generalized anxiety disorder)    Patient stopped taking Buspar a few months ago.  Reports he is doing okay without it.  He would like me to renew the prescription today in case he gets tested with the [employer] so he has a current prescription.  He does not plan on restarting the medication for now although I did offer it for him.  Prescription renewed.      Relevant Medications   busPIRone (BUSPAR) 15 MG tablet   Other Visit Diagnoses     Annual physical exam    -  Primary   Encounter for lipid screening for cardiovascular disease       Relevant Orders   Lipid panel   Screening for iron deficiency anemia       Relevant Orders   CBC with Differential/Platelet   Screening for metabolic disorder       Relevant Orders   COMPLETE METABOLIC PANEL WITH GFR        Discussed aspirin prophylaxis for myocardial infarction prevention and decision was no  LABORATORY TESTING:  Health maintenance labs ordered today as discussed above.   IMMUNIZATIONS:   - Tdap: Tetanus vaccination status reviewed: last tetanus booster within 10  years. - Influenza: Up to date - Pneumovax: Not applicable - Prevnar: Not applicable - HPV: Not applicable - Zostavax vaccine: Not applicable - COVID-19 vaccine: has had 2 vaccines  SCREENING: - Colonoscopy: Not applicable  Discussed with patient purpose of the colonoscopy is to detect colon cancer at curable precancerous or early stages   - AAA Screening: Not applicable  -Hearing Test: Not applicable  -Spirometry: Not applicable   PATIENT COUNSELING:    Sexuality: Discussed sexually transmitted diseases, partner selection, use of condoms, avoidance of unintended pregnancy  and contraceptive alternatives.   Advised to avoid cigarette smoking.  I discussed with the patient that most people either abstain from alcohol or drink within safe limits (<=14/week and <=4 drinks/occasion for males, <=7/weeks and <= 3 drinks/occasion for females) and that the risk for alcohol disorders and other health effects rises proportionally with the number of drinks per week and how often a drinker exceeds daily limits.  Discussed cessation/primary prevention of drug use and availability of treatment for abuse.   Diet: Encouraged to  adjust caloric intake to maintain  or achieve ideal body weight, to reduce intake of dietary saturated fat and total fat, to limit sodium intake by avoiding high sodium foods and not adding table salt, and to maintain adequate dietary potassium and calcium preferably from fresh fruits, vegetables, and low-fat dairy products.    stressed the importance of regular exercise  Injury prevention: Discussed safety belts, safety helmets, smoke detector, smoking near bedding or upholstery.   Dental health: Discussed importance of regular tooth brushing, flossing, and dental visits.   Follow up plan: NEXT PREVENTATIVE PHYSICAL DUE IN 1 YEAR. No follow-ups on file.

## 2021-01-18 LAB — COMPLETE METABOLIC PANEL WITH GFR
AG Ratio: 1.8 (calc) (ref 1.0–2.5)
ALT: 16 U/L (ref 9–46)
AST: 31 U/L (ref 10–40)
Albumin: 4.6 g/dL (ref 3.6–5.1)
Alkaline phosphatase (APISO): 58 U/L (ref 36–130)
BUN: 16 mg/dL (ref 7–25)
CO2: 28 mmol/L (ref 20–32)
Calcium: 9.3 mg/dL (ref 8.6–10.3)
Chloride: 104 mmol/L (ref 98–110)
Creat: 0.99 mg/dL (ref 0.60–1.26)
Globulin: 2.6 g/dL (calc) (ref 1.9–3.7)
Glucose, Bld: 87 mg/dL (ref 65–99)
Potassium: 4.5 mmol/L (ref 3.5–5.3)
Sodium: 139 mmol/L (ref 135–146)
Total Bilirubin: 0.6 mg/dL (ref 0.2–1.2)
Total Protein: 7.2 g/dL (ref 6.1–8.1)
eGFR: 102 mL/min/{1.73_m2} (ref >=60)

## 2021-01-18 LAB — CBC WITH DIFFERENTIAL/PLATELET
Absolute Monocytes: 506 cells/uL (ref 200–950)
Basophils Absolute: 51 cells/uL (ref 0–200)
Basophils Relative: 1.1 %
Eosinophils Absolute: 92 cells/uL (ref 15–500)
Eosinophils Relative: 2 %
HCT: 44.5 % (ref 38.5–50.0)
Hemoglobin: 16 g/dL (ref 13.2–17.1)
Lymphs Abs: 1785 cells/uL (ref 850–3900)
MCH: 32.5 pg (ref 27.0–33.0)
MCHC: 36 g/dL (ref 32.0–36.0)
MCV: 90.4 fL (ref 80.0–100.0)
MPV: 12 fL (ref 7.5–12.5)
Monocytes Relative: 11 %
Neutro Abs: 2167 cells/uL (ref 1500–7800)
Neutrophils Relative %: 47.1 %
Platelets: 161 10*3/uL (ref 140–400)
RBC: 4.92 10*6/uL (ref 4.20–5.80)
RDW: 12.5 % (ref 11.0–15.0)
Total Lymphocyte: 38.8 %
WBC: 4.6 10*3/uL (ref 3.8–10.8)

## 2021-01-18 LAB — LIPID PANEL
Cholesterol: 154 mg/dL (ref <200)
HDL: 51 mg/dL (ref >=40)
LDL Cholesterol (Calc): 84 mg/dL (calc)
Non-HDL Cholesterol (Calc): 103 mg/dL (calc) (ref <130)
Total CHOL/HDL Ratio: 3 (calc) (ref <5.0)
Triglycerides: 91 mg/dL (ref <150)

## 2021-01-22 ENCOUNTER — Encounter: Payer: Self-pay | Admitting: Nurse Practitioner

## 2021-05-31 ENCOUNTER — Ambulatory Visit: Payer: Self-pay

## 2021-06-30 ENCOUNTER — Ambulatory Visit: Admitting: Family Medicine

## 2021-07-07 ENCOUNTER — Emergency Department (HOSPITAL_COMMUNITY)
Admission: EM | Admit: 2021-07-07 | Discharge: 2021-07-07 | Disposition: A | Discharge status: Home / Self Care | Attending: Emergency Medicine | Admitting: Emergency Medicine

## 2021-07-07 ENCOUNTER — Emergency Department (HOSPITAL_COMMUNITY)

## 2021-07-07 ENCOUNTER — Encounter (HOSPITAL_COMMUNITY): Payer: Self-pay

## 2021-07-07 ENCOUNTER — Other Ambulatory Visit (HOSPITAL_COMMUNITY): Payer: Self-pay

## 2021-07-07 ENCOUNTER — Emergency Department (HOSPITAL_COMMUNITY): Admit: 2021-07-07 | Discharge: 2021-07-07 | Disposition: A | Discharge status: Home / Self Care

## 2021-07-07 DIAGNOSIS — S060X0A Concussion without loss of consciousness, initial encounter: Secondary | ICD-10-CM | POA: Insufficient documentation

## 2021-07-07 DIAGNOSIS — Y92410 Unspecified street and highway as the place of occurrence of the external cause: Secondary | ICD-10-CM | POA: Diagnosis not present

## 2021-07-07 DIAGNOSIS — S0990XA Unspecified injury of head, initial encounter: Secondary | ICD-10-CM | POA: Diagnosis present

## 2021-07-07 MED ORDER — ACETAMINOPHEN 325 MG PO TABS
650.0000 mg | ORAL_TABLET | Freq: Once | ORAL | Status: AC
  Administered 2021-07-07: 650 mg via ORAL
  Filled 2021-07-07: qty 2
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
## 2022-01-19 ENCOUNTER — Encounter: Admitting: Nurse Practitioner

## 2022-01-19 ENCOUNTER — Encounter: Admitting: Family Medicine

## 2022-01-20 DIAGNOSIS — L708 Other acne: Secondary | ICD-10-CM | POA: Insufficient documentation

## 2022-01-29 ENCOUNTER — Encounter: Payer: Self-pay | Admitting: *Deleted

## 2022-03-18 ENCOUNTER — Ambulatory Visit
Admission: RE | Admit: 2022-03-18 | Discharge: 2022-03-18 | Disposition: A | Discharge status: Home / Self Care | Source: Ambulatory Visit | Attending: Internal Medicine | Admitting: Internal Medicine

## 2022-03-18 VITALS — BP 119/80 | HR 82 | Temp 98.3°F | Resp 18

## 2022-03-18 DIAGNOSIS — R6889 Other general symptoms and signs: Secondary | ICD-10-CM

## 2022-03-18 DIAGNOSIS — H109 Unspecified conjunctivitis: Secondary | ICD-10-CM

## 2022-03-18 DIAGNOSIS — Z20828 Contact with and (suspected) exposure to other viral communicable diseases: Secondary | ICD-10-CM

## 2022-03-18 MED ORDER — OFLOXACIN 0.3 % OP SOLN: OPHTHALMIC | 0 refills | Status: AC

## 2022-03-18 NOTE — ED Triage Notes (Signed)
Pt c/o flu exposure, right conjunctivitis, body chills, headache   Onset ~ 2 days ago

## 2022-03-18 NOTE — Discharge Instructions (Signed)
It appears that you have pinkeye of your eye.  I have prescribed an antibiotic drop to help treat this.  Keep contact lenses out until healed over.  Follow-up with eye doctor.  You most likely also have the flu given your associated symptoms and exposure.  It would be supportive care and symptom management at this point in the duration of your symptoms.  Follow-up if symptoms persist or worsen.

## 2022-03-18 NOTE — ED Provider Notes (Signed)
[facility]    CSN: [id number] Arrival date & time: 03/18/22  0947      History   Chief Complaint Chief Complaint  Patient presents with   Eye Problem    Entered by patient    HPI Richard Patrick is a 37 y.o. male.   Patient presents with flu exposure and left eye irritation.  Patient reports eye symptoms started yesterday and include drainage, irritation, itchiness, redness.  He reports that his eye has been crusted over when waking in the mornings over the past few days.  Denies injury or foreign body to the eye.  Patient does wear contact lenses but has been having them out ever since eye symptoms started.  He is currently wearing glasses.  Patient denies vision changes.  Patient reports that family members in the household recently tested positive for the flu.  He is experiencing intermittent chills but denies nasal congestion, runny nose, cough, chest pain, shortness of breath, nausea, vomiting, diarrhea, abdominal pain.  Has taken Tylenol for symptoms.  Denies history of asthma COPD and patient does not smoke cigarettes.   Eye Problem   Past Medical History:  Diagnosis Date   Allergy    seasonal   Anxiety    Phreesia 12/30/2019    Patient Active Problem List   Diagnosis Date Noted   Class 1 obesity 01/02/2020   Chronic insomnia 06/13/2018   GAD (generalized anxiety disorder) 06/13/2018   Acne vulgaris 09/19/2014   Vitamin D deficiency 06/05/2014   Allergic rhinitis 05/28/2014    Past Surgical History:  Procedure Laterality Date   ROTATOR CUFF REPAIR Left 2020   VASECTOMY N/A    Phreesia 12/30/2019       Home Medications    Prior to Admission medications   Medication Sig Start Date End Date Taking? Authorizing Provider  ofloxacin (OCUFLOX) 0.3 % ophthalmic solution Place 1 drop into the left eye every 4 (four) hours for 2 days, THEN 1 drop 4 (four) times daily for 5 days. 03/18/22 03/25/22 Yes Kobee Medlen, Michele Rockers, FNP  busPIRone (BUSPAR) 15 MG tablet  TAKE 1 TABLET(15 MG) BY MOUTH TWICE DAILY 01/17/21   Noemi Chapel A, NP  pantoprazole (PROTONIX) 40 MG tablet Take 1 tablet (40 mg total) by mouth daily. Patient not taking: Reported on 01/17/2021 01/02/21   Eulogio Bear, NP    Family History Family History  Problem Relation Age of Onset   Thyroid disease Sister    Hypothyroidism Sister    Prostate cancer Paternal Grandfather    Diabetes Paternal Uncle     Social History Social History   Tobacco Use   Smoking status: Never   Smokeless tobacco: Former    Types: Snuff  Substance Use Topics   Alcohol use: Yes    Alcohol/week: 1.0 standard drink of alcohol    Types: 1 Glasses of wine per week    Comment: once per week - drinks 3-4 liquour tequlia   Drug use: No     Allergies   Patient has no known allergies.   Review of Systems Review of Systems Per HPI  Physical Exam Triage Vital Signs ED Triage Vitals  Enc Vitals Group     BP 03/18/22 0957 119/80     Pulse Rate 03/18/22 0957 82     Resp 03/18/22 0957 18     Temp 03/18/22 0957 98.3 F (36.8 C)     Temp Source 03/18/22 0957 Oral     SpO2 03/18/22 0957 97 %  Weight --      Height --      Head Circumference --      Peak Flow --      Pain Score 03/18/22 0958 5     Pain Loc --      Pain Edu? --      Excl. in [place]? --    No data found.  Updated Vital Signs BP 119/80 (BP Location: Left Arm)   Pulse 82   Temp 98.3 F (36.8 C) (Oral)   Resp 18   SpO2 97%   Visual Acuity Right Eye Distance:   Left Eye Distance:   Bilateral Distance:    Right Eye Near:   Left Eye Near:    Bilateral Near:     Physical Exam Constitutional:      General: He is not in acute distress.    Appearance: Normal appearance. He is not toxic-appearing or diaphoretic.  HENT:     Head: Normocephalic and atraumatic.     Right Ear: Tympanic membrane and ear canal normal.     Left Ear: Tympanic membrane and ear canal normal.     Nose: No congestion.     Mouth/Throat:      Mouth: Mucous membranes are moist.     Pharynx: No posterior oropharyngeal erythema.  Eyes:     General: Lids are normal. Lids are everted, no foreign bodies appreciated. Vision grossly intact. Gaze aligned appropriately.     Extraocular Movements: Extraocular movements intact.     Conjunctiva/sclera:     Right eye: Right conjunctiva is not injected. No chemosis, exudate or hemorrhage.    Left eye: Left conjunctiva is injected. Chemosis present. No exudate or hemorrhage.    Pupils: Pupils are equal, round, and reactive to light.     Left eye: No corneal abrasion or fluorescein uptake.     Comments: No fluorescein reuptake noted.  No indication of corneal ulcer or abrasion.  Cardiovascular:     Rate and Rhythm: Normal rate and regular rhythm.     Pulses: Normal pulses.     Heart sounds: Normal heart sounds.  Pulmonary:     Effort: Pulmonary effort is normal. No respiratory distress.     Breath sounds: Normal breath sounds. No stridor. No wheezing, rhonchi or rales.  Abdominal:     General: Abdomen is flat. Bowel sounds are normal.     Palpations: Abdomen is soft.  Musculoskeletal:        General: Normal range of motion.     Cervical back: Normal range of motion.  Skin:    General: Skin is warm and dry.  Neurological:     General: No focal deficit present.     Mental Status: He is alert and oriented to person, place, and time. Mental status is at baseline.  Psychiatric:        Mood and Affect: Mood normal.        Behavior: Behavior normal.      UC Treatments / Results  Labs (all labs ordered are listed, but only abnormal results are displayed) Labs Reviewed - No data to display  EKG   Radiology No results found.  Procedures Procedures (including critical care time)  Medications Ordered in UC Medications - No data to display  Initial Impression / Assessment and Plan / UC Course  I have reviewed the triage vital signs and the nursing notes.  Pertinent labs &  imaging results that were available during my care of the patient were reviewed  by me and considered in my medical decision making (see chart for details).     Eye symptoms appear consistent with left bacterial conjunctivitis.  Fluorescein stain completed with no indication of corneal abrasion or ulcer.  Will treat with ofloxacin given the patient is a contact lens wearer.  Advised patient to keep contact lenses out until healed over and change them if able.  I also advised patient to follow-up with eye doctor for further evaluation and management.  Visual acuity appears normal.   Patient's chills are mostly related to influenza given close exposure.  Do not have flu testing capabilities here in urgent care at this time but it would not change treatment given duration of symptoms.  At this point in duration of symptoms, it would be supportive care and symptom management which was discussed with patient.  Discussed adequate fluid hydration and rest as well.  Advised patient to follow-up if any symptoms persist or worsen.  Patient verbalized understanding and was agreeable with plan. Final Clinical Impressions(s) / UC Diagnoses   Final diagnoses:  Bacterial conjunctivitis of left eye  Flu-like symptoms  Exposure to the flu     Discharge Instructions      It appears that you have pinkeye of your eye.  I have prescribed an antibiotic drop to help treat this.  Keep contact lenses out until healed over.  Follow-up with eye doctor.  You most likely also have the flu given your associated symptoms and exposure.  It would be supportive care and symptom management at this point in the duration of your symptoms.  Follow-up if symptoms persist or worsen.    ED Prescriptions     Medication Sig Dispense Auth. Provider   ofloxacin (OCUFLOX) 0.3 % ophthalmic solution Place 1 drop into the left eye every 4 (four) hours for 2 days, THEN 1 drop 4 (four) times daily for 5 days. 5 mL Teodora Medici, [place]       PDMP not reviewed this encounter.   Teodora Medici,  03/18/22 1057

## 2022-10-08 ENCOUNTER — Ambulatory Visit: Admission: EM | Admit: 2022-10-08 | Discharge: 2022-10-08 | Disposition: A | Discharge status: Home / Self Care

## 2022-10-08 DIAGNOSIS — H00012 Hordeolum externum right lower eyelid: Secondary | ICD-10-CM | POA: Diagnosis not present

## 2022-10-08 MED ORDER — ERYTHROMYCIN 5 MG/GM OP OINT: TOPICAL_OINTMENT | OPHTHALMIC | 0 refills | Status: DC

## 2022-10-08 NOTE — ED Triage Notes (Signed)
"  I have a stye on inside of my right lower eyelid". No visual disturbance. Some discharge. Some drainage. No injury to eye.

## 2022-10-08 NOTE — ED Provider Notes (Signed)
[facility]    CSN: [id number] Arrival date & time: 10/08/22  1832      History   Chief Complaint Chief Complaint  Patient presents with   Eye Problem    HPI Richard Patrick is a 37 y.o. male.   Patient here today for evaluation of stye to his right lower eyelid.  Symptoms started 2 days ago.  He states he has had same in the past.  He denies any visual disturbance.  He has had some mild discharge.  He denies any injury that he is aware of.  He has tried using antibiotic drops without resolution as well as warm compresses.  The history is provided by the patient.    Past Medical History:  Diagnosis Date   Allergy    seasonal   Anxiety    Phreesia 12/30/2019    Patient Active Problem List   Diagnosis Date Noted   Class 1 obesity 01/02/2020   Chronic insomnia 06/13/2018   GAD (generalized anxiety disorder) 06/13/2018   Acne vulgaris 09/19/2014   Vitamin D deficiency 06/05/2014   Allergic rhinitis 05/28/2014    Past Surgical History:  Procedure Laterality Date   ROTATOR CUFF REPAIR Left 2020   VASECTOMY N/A    Phreesia 12/30/2019       Home Medications    Prior to Admission medications   Medication Sig Start Date End Date Taking? Authorizing Provider  busPIRone (BUSPAR) 15 MG tablet TAKE 1 TABLET(15 MG) BY MOUTH TWICE DAILY 01/17/21  Yes Cathlean Marseilles A, NP  erythromycin ophthalmic ointment Place a 1/2 inch ribbon of ointment into the lower eyelid. 10/08/22  Yes Tomi Bamberger, PA-C  ibuprofen (ADVIL) 800 MG tablet Take 800 mg by mouth every 8 (eight) hours as needed.   Yes [provider]  minocycline (MINOCIN) 50 MG capsule Take 50 mg by mouth 2 (two) times daily. 01/20/22  Yes [provider]  pantoprazole (PROTONIX) 40 MG tablet Take 1 tablet (40 mg total) by mouth daily. Patient not taking: Reported on 01/17/2021 01/02/21   Valentino Nose, NP    Family History Family History  Problem Relation Age of Onset    Thyroid disease Sister    Hypothyroidism Sister    Prostate cancer Paternal Grandfather    Diabetes Paternal Uncle     Social History Social History   Tobacco Use   Smoking status: Never   Smokeless tobacco: Current    Types: Chew  Vaping Use   Vaping status: Never Used  Substance Use Topics   Alcohol use: Not Currently    Alcohol/week: 1.0 standard drink of alcohol    Types: 1 Glasses of wine per week    Comment: once per week - drinks 3-4 liquour tequlia   Drug use: Never     Allergies   Patient has no known allergies.   Review of Systems Review of Systems  Constitutional:  Negative for chills and fever.  HENT:  Negative for congestion.   Eyes:  Negative for discharge, redness and visual disturbance.  Respiratory:  Negative for shortness of breath.   Neurological:  Negative for numbness.     Physical Exam Triage Vital Signs ED Triage Vitals  Encounter Vitals Group     BP      Systolic BP Percentile      Diastolic BP Percentile      Pulse      Resp      Temp      Temp src  SpO2      Weight      Height      Head Circumference      Peak Flow      Pain Score      Pain Loc      Pain Education      Exclude from Growth Chart    No data found.  Updated Vital Signs BP 121/72 (BP Location: Left Arm)   Pulse 65   Temp 98.7 F (37.1 C) (Oral)   Resp 18   Ht 5\' 9"  (1.753 m)   Wt 208 lb (94.3 kg)   SpO2 98%   BMI 30.72 kg/m      Physical Exam Vitals and nursing note reviewed.  Constitutional:      General: He is not in acute distress.    Appearance: Normal appearance. He is not ill-appearing.  HENT:     Head: Normocephalic and atraumatic.     Nose: Nose normal. No congestion or rhinorrhea.  Eyes:     Extraocular Movements: Extraocular movements intact.     Conjunctiva/sclera: Conjunctivae normal.     Pupils: Pupils are equal, round, and reactive to light.     Comments: Mild erythema and swelling noted to right lower eyelid   Cardiovascular:     Rate and Rhythm: Normal rate.  Pulmonary:     Effort: Pulmonary effort is normal.  Neurological:     Mental Status: He is alert.  Psychiatric:        Mood and Affect: Mood normal.        Behavior: Behavior normal.        Thought Content: Thought content normal.      UC Treatments / Results  Labs (all labs ordered are listed, but only abnormal results are displayed) Labs Reviewed - No data to display  EKG   Radiology No results found.  Procedures Procedures (including critical care time)  Medications Ordered in UC Medications - No data to display  Initial Impression / Assessment and Plan / UC Course  I have reviewed the triage vital signs and the nursing notes.  Pertinent labs & imaging results that were available during my care of the patient were reviewed by me and considered in my medical decision making (see chart for details).    Recommended continue warm compresses and will treat with about appointment.  Advise follow-up if no gradual improvement with any further concerns.  Discussed ultimately may need to see ophthalmologist if no resolution with conservative treatment.  Final Clinical Impressions(s) / UC Diagnoses   Final diagnoses:  Hordeolum externum of right lower eyelid   Discharge Instructions   None    ED Prescriptions     Medication Sig Dispense Auth. Provider   erythromycin ophthalmic ointment Place a 1/2 inch ribbon of ointment into the lower eyelid. 3.5 g Tomi Bamberger, PA-C      PDMP not reviewed this encounter.   Tomi Bamberger, PA-C 10/08/22 2000

## 2023-05-05 ENCOUNTER — Ambulatory Visit (INDEPENDENT_AMBULATORY_CARE_PROVIDER_SITE_OTHER): Admitting: Vascular Surgery

## 2023-05-05 ENCOUNTER — Encounter: Payer: Self-pay | Admitting: Vascular Surgery

## 2023-05-05 VITALS — BP 113/74 | HR 74 | Temp 98.2°F | Ht 69.0 in | Wt 216.0 lb

## 2023-05-05 DIAGNOSIS — I879 Disorder of vein, unspecified: Secondary | ICD-10-CM

## 2023-05-05 NOTE — Progress Notes (Signed)
 Patient ID: Richard Patrick, male   DOB: 09-Nov-1985, 38 y.o.   MRN: [id number]  Reason for Consult: New Patient (Initial Visit)   Referred by Odis Luster, PA-C  Subjective:     HPI:  Richard Patrick is a 38 y.o. male without significant history of vascular disease.  He has had a few episodes where he notes swelling on his chest unrelated to any physical activity.  He has had left shoulder surgery in the past to repair of labrum no other shoulder injuries or neck injuries.  He is right-hand dominant.  Most recently he noted a swelling on the right chest which was associated with pain.  He states that he has a picture at home but now his phone.  He denies any personal or family history of DVT.  Past Medical History:  Diagnosis Date   Allergy    seasonal   Anxiety    Phreesia 12/30/2019   Family History  Problem Relation Age of Onset   Thyroid disease Sister    Hypothyroidism Sister    Prostate cancer Paternal Grandfather    Diabetes Paternal Uncle    Past Surgical History:  Procedure Laterality Date   ROTATOR CUFF REPAIR Left 2020   VASECTOMY N/A    Phreesia 12/30/2019    Short Social History:  Social History   Tobacco Use   Smoking status: Never   Smokeless tobacco: Current    Types: Chew  Substance Use Topics   Alcohol use: Not Currently    Alcohol/week: 1.0 standard drink of alcohol    Types: 1 Glasses of wine per week    Comment: once per week - drinks 3-4 liquour tequlia    No Known Allergies  Current Outpatient Medications  Medication Sig Dispense Refill   busPIRone (BUSPAR) 15 MG tablet TAKE 1 TABLET(15 MG) BY MOUTH TWICE DAILY 180 tablet 3   minocycline (MINOCIN) 50 MG capsule Take 50 mg by mouth 2 (two) times daily.     ibuprofen (ADVIL) 800 MG tablet Take 800 mg by mouth every 8 (eight) hours as needed. (Patient not taking: Reported on 05/05/2023)     No current facility-administered medications for this visit.    Review of Systems  Constitutional:   Constitutional negative. HENT: HENT negative.  Eyes: Eyes negative.  Respiratory: Respiratory negative.  Cardiovascular: Cardiovascular negative.  GI: Gastrointestinal negative.  Musculoskeletal: Musculoskeletal negative.       Vein bulging right chest as above Skin: Skin negative.  Neurological: Neurological negative. Hematologic: Hematologic/lymphatic negative.  Psychiatric: Psychiatric negative.        Objective:  Objective   Vitals:   05/05/23 0903  BP: 113/74  Pulse: 74  Temp: 98.2 F (36.8 C)  SpO2: 94%  Weight: 216 lb (98 kg)  Height: 5\' 9"  (1.753 m)   Body mass index is 31.9 kg/m.  Physical Exam HENT:     Head: Normocephalic.     Nose: Nose normal.     Mouth/Throat:     Mouth: Mucous membranes are moist.  Eyes:     Pupils: Pupils are equal, round, and reactive to light.  Cardiovascular:     Rate and Rhythm: Normal rate.     Pulses: Normal pulses.  Pulmonary:     Effort: Pulmonary effort is normal.  Abdominal:     General: Abdomen is flat.     Palpations: Abdomen is soft.  Musculoskeletal:     Cervical back: Normal range of motion and neck supple.     Right  lower leg: No edema.     Left lower leg: No edema.  Skin:    General: Skin is warm.     Capillary Refill: Capillary refill takes less than 2 seconds.  Neurological:     General: No focal deficit present.     Mental Status: He is alert.  Psychiatric:        Mood and Affect: Mood normal.        Thought Content: Thought content normal.     Data: Ultrasound was used at the bedside today by scan both sides of his chest including up into his neck to provide did not see any evidence of varicosities or enlarged veins.     Assessment/Plan:    38 year old male sent for concern of enlarged vein on the right chest which is not evident today by other physical exam or bedside ultrasound.  As such there is no need for intervention I can see him on an as-needed basis.  All questions were answered and he  demonstrates good understanding.     Maeola Harman MD [facility]

## 2023-06-26 ENCOUNTER — Ambulatory Visit: Admission: EM | Admit: 2023-06-26 | Discharge: 2023-06-26 | Disposition: A | Discharge status: Home / Self Care

## 2023-06-26 DIAGNOSIS — H52229 Regular astigmatism, unspecified eye: Secondary | ICD-10-CM | POA: Insufficient documentation

## 2023-06-26 DIAGNOSIS — H5210 Myopia, unspecified eye: Secondary | ICD-10-CM | POA: Insufficient documentation

## 2023-06-26 DIAGNOSIS — L089 Local infection of the skin and subcutaneous tissue, unspecified: Secondary | ICD-10-CM

## 2023-06-26 DIAGNOSIS — N63 Unspecified lump in unspecified breast: Secondary | ICD-10-CM | POA: Insufficient documentation

## 2023-06-26 DIAGNOSIS — S90862A Insect bite (nonvenomous), left foot, initial encounter: Secondary | ICD-10-CM

## 2023-06-26 DIAGNOSIS — W57XXXA Bitten or stung by nonvenomous insect and other nonvenomous arthropods, initial encounter: Secondary | ICD-10-CM | POA: Diagnosis not present

## 2023-06-26 DIAGNOSIS — Z023 Encounter for examination for recruitment to armed forces: Secondary | ICD-10-CM | POA: Insufficient documentation

## 2023-06-26 DIAGNOSIS — Z13850 Encounter for screening for traumatic brain injury: Secondary | ICD-10-CM | POA: Insufficient documentation

## 2023-06-26 DIAGNOSIS — R21 Rash and other nonspecific skin eruption: Secondary | ICD-10-CM | POA: Insufficient documentation

## 2023-06-26 DIAGNOSIS — L723 Sebaceous cyst: Secondary | ICD-10-CM | POA: Insufficient documentation

## 2023-06-26 DIAGNOSIS — Z0110 Encounter for examination of ears and hearing without abnormal findings: Secondary | ICD-10-CM | POA: Insufficient documentation

## 2023-06-26 MED ORDER — DOXYCYCLINE HYCLATE 100 MG PO CAPS: 100.0000 mg | ORAL_CAPSULE | Freq: Two times a day (BID) | ORAL | 0 refills | Status: AC

## 2023-06-26 NOTE — ED Triage Notes (Signed)
"  I have a spider bit on my left foot (inside of foot), noticed a few days ago". No fever.

## 2023-06-26 NOTE — Discharge Instructions (Addendum)
 Insect bite of the left medial foot. This area is very small and minimal area that would benefit from drainage. Given the location, we will treat with antibiotics by mouth and monitor. If the area seems to worsen on antibiotics, then return to urgent care. We will treat with the following:  Doxycycline 100 mg twice daily for 7 days. Take this with food.  Monitor the area for increased swelling, redness extending into the foot, increased pain. May want to pad the area when wearing boots.  Return to urgent care or PCP if symptoms worsen or fail to resolve.

## 2023-06-26 NOTE — ED Provider Notes (Signed)
 [facility]    CSN: [id number] Arrival date & time: 06/26/23  0907      History   Chief Complaint Chief Complaint  Patient presents with   Insect Bite    HPI Richard Patrick is a 38 y.o. male.   38 year old male who presents urgent care with complaints of an insect bite on the left medial foot.  He reports that he thinks this was a spider.  He did put some shoes on that have been in the garage and shortly after this he developed symptoms.  The area has gotten slightly larger in the last few days.  It is causing some mild pain.  He denies any fevers or extending redness.  He has not had any drainage from the area.  He denies any other constitutional symptoms.  He is in the [employer] and does wear boots.     Past Medical History:  Diagnosis Date   Allergy    seasonal   Anxiety    [employer]reesia 12/30/2019    Patient Active Problem List   Diagnosis Date Noted   Mass of breast 06/26/2023   Myopia 06/26/2023   Regular astigmatism 06/26/2023   Sebaceous cyst 06/26/2023   Encounter for examination for recruitment to armed forces 06/26/2023   Examination of ears and hearing 06/26/2023   Special screening for traumatic brain injury 06/26/2023   Rash 06/26/2023   Other acne 01/20/2022   Class 1 obesity 01/02/2020   Chronic insomnia 06/13/2018   GAD (generalized anxiety disorder) 06/13/2018   Acne vulgaris 09/19/2014   Vitamin D  deficiency 06/05/2014   Allergic rhinitis 05/28/2014    Past Surgical History:  Procedure Laterality Date   ROTATOR CUFF REPAIR Left 2020   VASECTOMY N/A    [employer]reesia 12/30/2019       Home Medications    Prior to Admission medications   Medication Sig Start Date End Date Taking? Authorizing Provider  benzoyl peroxide 10 % LIQD Apply 1 Application topically as directed. 01/25/23  Yes [provider]  busPIRone  (BUSPAR ) 15 MG tablet TAKE 1 TABLET(15 MG) BY MOUTH TWICE DAILY 01/17/21  Yes Thena Fireman A, NP  doxycycline  (VIBRAMYCIN) 100 MG capsule Take 1 capsule (100 mg total) by mouth 2 (two) times daily for 7 days. 06/26/23 07/03/23 Yes Bijan Ridgley A, PA-C  meloxicam (MOBIC) 15 MG tablet Take 15 mg by mouth daily. 07/16/22  Yes [provider]  ibuprofen  (ADVIL ) 800 MG tablet Take 800 mg by mouth every 8 (eight) hours as needed. Patient not taking: Reported on 05/05/2023    [provider]  minocycline  (MINOCIN ) 50 MG capsule Take 50 mg by mouth 2 (two) times daily. 01/20/22   [provider]    Family History Family History  Problem Relation Age of Onset   Thyroid disease Sister    Hypothyroidism Sister    Prostate cancer Paternal Grandfather    Diabetes Paternal Uncle     Social History Social History   Tobacco Use   Smoking status: Never   Smokeless tobacco: Current    Types: Chew  Vaping Use   Vaping status: Never Used  Substance Use Topics   Alcohol use: Yes    Alcohol/week: 1.0 standard drink of alcohol    Types: 1 Glasses of wine per week    Comment: once per week - drinks 3-4 liquour tequlia   Drug use: Never     Allergies   Patient has no known allergies.   Review of Systems Review  of Systems  Constitutional:  Negative for chills and fever.  HENT:  Negative for ear pain and sore throat.   Eyes:  Negative for pain and visual disturbance.  Respiratory:  Negative for cough and shortness of breath.   Cardiovascular:  Negative for chest pain and palpitations.  Gastrointestinal:  Negative for abdominal pain and vomiting.  Genitourinary:  Negative for dysuria and hematuria.  Musculoskeletal:  Negative for arthralgias and back pain.  Skin:  Positive for color change (small area on the left medial foot). Negative for rash.  Neurological:  Negative for seizures and syncope.  All other systems reviewed and are negative.    Physical Exam Triage Vital Signs ED Triage Vitals  Encounter Vitals Group     BP 06/26/23 0924 107/73     Systolic BP  Percentile --      Diastolic BP Percentile --      Pulse Rate 06/26/23 0924 81     Resp 06/26/23 0924 18     Temp 06/26/23 0924 97.7 F (36.5 C)     Temp Source 06/26/23 0924 Oral     SpO2 06/26/23 0924 97 %     Weight 06/26/23 0922 212 lb (96.2 kg)     Height 06/26/23 0922 5\' 9"  (1.753 m)     Head Circumference --      Peak Flow --      Pain Score 06/26/23 0920 5     Pain Loc --      Pain Education --      Exclude from Growth Chart --    No data found.  Updated Vital Signs BP 107/73 (BP Location: Right Arm)   Pulse 81   Temp 97.7 F (36.5 C) (Oral)   Resp 18   Ht 5\' 9"  (1.753 m)   Wt 212 lb (96.2 kg)   SpO2 97%   BMI 31.31 kg/m   Visual Acuity Right Eye Distance:   Left Eye Distance:   Bilateral Distance:    Right Eye Near:   Left Eye Near:    Bilateral Near:     Physical Exam Vitals and nursing note reviewed.  Constitutional:      General: He is not in acute distress.    Appearance: He is well-developed.  HENT:     Head: Normocephalic and atraumatic.  Eyes:     Conjunctiva/sclera: Conjunctivae normal.  Cardiovascular:     Rate and Rhythm: Normal rate and regular rhythm.     Heart sounds: No murmur heard. Pulmonary:     Effort: Pulmonary effort is normal. No respiratory distress.     Breath sounds: Normal breath sounds.  Abdominal:     Palpations: Abdomen is soft.     Tenderness: There is no abdominal tenderness.  Musculoskeletal:        General: No swelling.     Cervical back: Neck supple.       Feet:  Skin:    General: Skin is warm and dry.     Capillary Refill: Capillary refill takes less than 2 seconds.  Neurological:     Mental Status: He is alert.  Psychiatric:        Mood and Affect: Mood normal.      UC Treatments / Results  Labs (all labs ordered are listed, but only abnormal results are displayed) Labs Reviewed - No data to display  EKG   Radiology No results found.  Procedures Procedures (including critical care  time)  Medications Ordered in UC Medications -  No data to display  Initial Impression / Assessment and Plan / UC Course  I have reviewed the triage vital signs and the nursing notes.  Pertinent labs & imaging results that were available during my care of the patient were reviewed by me and considered in my medical decision making (see chart for details).     Insect bite of left foot, initial encounter  Skin infection   Insect bite of the left medial foot. This area is very small and minimal area that would benefit from drainage. Given the location, we will treat with antibiotics by mouth and monitor. If the area seems to worsen on antibiotics, then return to urgent care. We will treat with the following:  Doxycycline 100 mg twice daily for 7 days.  Monitor the area for increased swelling, redness extending into the foot, increased pain. May want to pad the area when wearing boots.  Return to urgent care or PCP if symptoms worsen or fail to resolve.    Final Clinical Impressions(s) / UC Diagnoses   Final diagnoses:  Insect bite of left foot, initial encounter  Skin infection     Discharge Instructions      Insect bite of the left medial foot. This area is very small and minimal area that would benefit from drainage. Given the location, we will treat with antibiotics by mouth and monitor. If the area seems to worsen on antibiotics, then return to urgent care. We will treat with the following:  Doxycycline 100 mg twice daily for 7 days. Take this with food.  Monitor the area for increased swelling, redness extending into the foot, increased pain. May want to pad the area when wearing boots.  Return to urgent care or PCP if symptoms worsen or fail to resolve.    ED Prescriptions     Medication Sig Dispense Auth. Provider   doxycycline (VIBRAMYCIN) 100 MG capsule Take 1 capsule (100 mg total) by mouth 2 (two) times daily for 7 days. 14 capsule Kreg Pesa, [place]       PDMP not reviewed this encounter.   Kreg Pesa, PA-C 06/26/23 1002
